# Patient Record
Sex: Male | Born: 1960 | Race: White | Hispanic: No | Marital: Married | Smoking: Former smoker
Health system: Southern US, Community
[De-identification: ages and names within clinical notes are randomized; demographics above are authoritative.]

## PROBLEM LIST (undated history)

## (undated) DIAGNOSIS — K37 Unspecified appendicitis: Secondary | ICD-10-CM

## (undated) DIAGNOSIS — E079 Disorder of thyroid, unspecified: Secondary | ICD-10-CM

## (undated) HISTORY — DX: Unspecified appendicitis: K37

## (undated) HISTORY — PX: HERNIA REPAIR: SHX51

---
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
## 2007-04-03 ENCOUNTER — Emergency Department: Payer: Self-pay | Admitting: Emergency Medicine

## 2008-12-06 ENCOUNTER — Emergency Department: Payer: Self-pay | Admitting: Unknown Physician Specialty

## 2008-12-17 DIAGNOSIS — Z8249 Family history of ischemic heart disease and other diseases of the circulatory system: Secondary | ICD-10-CM | POA: Insufficient documentation

## 2008-12-20 DIAGNOSIS — Z87891 Personal history of nicotine dependence: Secondary | ICD-10-CM | POA: Insufficient documentation

## 2009-01-27 DIAGNOSIS — E781 Pure hyperglyceridemia: Secondary | ICD-10-CM | POA: Insufficient documentation

## 2013-10-22 HISTORY — PX: THYROIDECTOMY: SHX17

## 2013-10-27 ENCOUNTER — Ambulatory Visit: Payer: Self-pay | Admitting: Otolaryngology

## 2013-11-18 ENCOUNTER — Ambulatory Visit: Payer: Self-pay | Admitting: Otolaryngology

## 2013-11-26 LAB — PATHOLOGY REPORT

## 2014-03-23 LAB — LIPID PANEL
Cholesterol: 189 mg/dL (ref 0–200)
HDL: 67 mg/dL (ref 35–70)
LDL Cholesterol: 97 mg/dL
Triglycerides: 123 mg/dL (ref 40–160)

## 2014-03-23 LAB — BASIC METABOLIC PANEL
BUN: 19 mg/dL (ref 4–21)
Creatinine: 1.2 mg/dL (ref 0.6–1.3)
Glucose: 99 mg/dL
Potassium: 4.5 mmol/L (ref 3.4–5.3)
Sodium: 141 mmol/L (ref 137–147)

## 2014-03-23 LAB — PSA: PSA: 2.7

## 2014-08-26 ENCOUNTER — Ambulatory Visit: Payer: Self-pay | Admitting: Surgery

## 2014-08-26 LAB — CBC WITH DIFFERENTIAL/PLATELET
Basophil #: 0.1 10*3/uL (ref 0.0–0.1)
Basophil %: 0.6 %
Eosinophil #: 0.2 10*3/uL (ref 0.0–0.7)
Eosinophil %: 2.1 %
HCT: 41.5 % (ref 40.0–52.0)
HGB: 13.1 g/dL (ref 13.0–18.0)
Lymphocyte #: 2 10*3/uL (ref 1.0–3.6)
Lymphocyte %: 24 %
MCH: 22 pg — ABNORMAL LOW (ref 26.0–34.0)
MCHC: 31.7 g/dL — ABNORMAL LOW (ref 32.0–36.0)
MCV: 69 fL — ABNORMAL LOW (ref 80–100)
Monocyte #: 0.5 x10 3/mm (ref 0.2–1.0)
Monocyte %: 5.8 %
Neutrophil #: 5.5 10*3/uL (ref 1.4–6.5)
Neutrophil %: 67.5 %
Platelet: 152 10*3/uL (ref 150–440)
RBC: 5.98 10*6/uL — ABNORMAL HIGH (ref 4.40–5.90)
RDW: 15.5 % — ABNORMAL HIGH (ref 11.5–14.5)
WBC: 8.2 10*3/uL (ref 3.8–10.6)

## 2014-09-02 ENCOUNTER — Ambulatory Visit: Payer: Self-pay | Admitting: Surgery

## 2014-10-08 ENCOUNTER — Ambulatory Visit: Payer: Self-pay | Admitting: Gastroenterology

## 2014-10-08 LAB — HM COLONOSCOPY

## 2015-02-12 NOTE — Op Note (Signed)
PATIENT NAME:  [name] Grimes Grimes, [name] Grimes MR#:  [id number] DATE OF BIRTH:  09-Feb-1961  DATE OF PROCEDURE:  09/02/2014  PREOPERATIVE DIAGNOSIS: Umbilical hernia.   POSTOPERATIVE DIAGNOSIS: Umbilical hernia.   PROCEDURE: Umbilical hernia repair.   SURGEON: Renda RollsWilton Lelynd Poer, MD    ANESTHESIA: General.   INDICATIONS: This 54 year old male reports bulging in the upper aspect of the abdomen. An umbilical hernia was demonstrated on physical exam, some 3 cm in dimension and repair was recommended for definitive treatment.   DESCRIPTION OF PROCEDURE: The patient was placed on the operating table in the supine position under general anesthesia. The abdomen was prepared with ChloraPrep and draped in a sterile manner. The bulge was noted in the upper aspect of the umbilicus approximately 3 cm in dimension. A transversely oriented supraumbilical curvilinear incision was made some 3 cm in length, carried down through a thin layer of subcutaneous tissue to encounter a herniated properitoneal fat which was dissected free from surrounding structures.  It was dissected away from the fascial ring defect and was inverted into the abdominal cavity.  Next the properitoneal fat was dissected away from the fascia circumferentially. A Bard soft mesh was cut to create an oval shape of some 1.2 x 1.8 cm in place in the properitoneal plane, oriented transversely, and was sutured to the overlying fascia with 0 Surgilon sutures. Next, the repair was carried out with a transversely oriented suture line of interrupted 0 Surgilon figure-of-eight sutures incorporating mesh into each suture. The repair looked good.  Hemostasis was intact. The fascia was infiltrated with 0.5% Sensorcaine with epinephrine. Subcutaneous tissues were infiltrated as well. The skin of the umbilicus was sutured to the fatty tissues just above the repair with 4-0 Monocryl. The skin was closed with running 4-0 Monocryl subcuticular suture and Dermabond. The patient tolerated  surgery satisfactorily and was prepared for transfer to the recovery room.     ____________________________ [name] Grimes CommonsJ. Renda RollsWilton Ahmaya Ostermiller, MD jws:DT D: 09/02/2014 14:18:32 ET T: 09/02/2014 17:28:21 ET JOB#: [id number]  cc: [name] Grimes HareJ. [name] Grimes Lilburn Straw, MD, <Dictator> [name] Grimes HareWILTON J Taevion Sikora MD ELECTRONICALLY SIGNED 09/05/2014 8:02

## 2015-02-12 NOTE — Op Note (Signed)
PATIENT NAME:  [name] Grimes, [name] Grimes MR#:  [id number] DATE OF BIRTH:  1961/06/01  DATE OF PROCEDURE:  11/18/2013  PREOPERATIVE DIAGNOSIS: Right thyroid nodules.   POSTOPERATIVE DIAGNOSIS: Right thyroid nodules.   PROCEDURE: Right hemithyroidectomy with isthmusectomy.   SURGEON: Ollen Grossaul S. Willeen CassBennett, MD  FIRST ASSISTANT: Cammy CopaPaul H. Juengel, MD  ANESTHESIA: General endotracheal.   INDICATIONS: The patient with multinodular thyroid gland with dominant nodules on the right side. FNA was nondiagnostic. One of the nodules appears to have enlarged recently.   FINDINGS: There were 2 prominent nodules in the right thyroid gland, each one nearly 2 cm in size.   COMPLICATIONS: None.   DESCRIPTION OF PROCEDURE: After obtaining informed consent, the patient was taken to the operating room and placed in the supine position. General endotracheal anesthesia was induced. The endotracheal tube was placed under direct visualization, and I was able to observe proper placement of the laryngeal nerve monitor electrodes between the vocal cords during the intubation to assure good positioning. The skin was then injected with 1% lidocaine with epinephrine 1:200,000. The patient was then prepped and draped in the usual sterile fashion. A 15-blade was used to incise the skin, carrying the incision down through the platysma with the Bovie. The strap muscles were identified and divided in the midline with the Harmonic scalpel. The strap muscles on the right side were then carefully dissected away from the surface of the thyroid gland and retracted laterally. Blunt dissection was then used to expose the thyroid gland on the right side. Dissection proceeded up into the superior pole, where superior pole vessels were identified and divided with the Harmonic scalpel as they were encountered. The vessels were divided right on the capsule of the gland, and a structure consistent with parathyroid tissue was identified and preserved along with  its vascular pedicle during this dissection. Dissection proceeded down the lateral surface of the gland, dissecting away soft fascial attachments either bluntly or with the Harmonic scalpel. Inferiorly, the inferior pole vessels were also dissected out and divided at the level of the capsule of the gland with the Harmonic scalpel. Once again, a small structure consistent with parathyroid tissue was identified and preserved within the vascular pedicle and dissected away from the thyroid gland itself. There were large nodules, both more superficially along the inferior pole of the thyroid gland near the isthmus as well as a deeper large nodule more in the midportion of the gland. These were carefully dissected away from surrounding tissues and the thyroid rotated medially. The recurrent laryngeal nerve was identified where it entered the larynx superiorly and confirmed with the nerve stimulator. The nerve was dissected out a short ways inferiorly to identify its course and then the gland dissected away from the trachea, dividing Berry's ligament with the nerve in full view. The gland was dissected completely off the face of the trachea and divided just past midline, removing the isthmus of the gland from the left lobe of the thyroid gland. The wound was irrigated and inspected. There was some minor oozing around Berry's ligament near the recurrent laryngeal nerve, and this was managed with Surgicel. A #10 TLS drain was placed and secured with a 3-0 Prolene suture. The strap muscles were reapproximated with 4-0 Vicryl. The subcutaneous closure was performed with 4-0 Vicryl suture, reapproximating the platysma muscle in the process. The skin was then closed with a running subcuticular 3-0 Prolene suture. Bacitracin ointment was then applied. The patient was then returned to the anesthesiologist for awakening. He was  awakened and taken to the recovery room in good condition postoperatively. Blood loss was less than 50  mL. The nerve was noted to stimulate appropriately at the completion of the case.   ____________________________ Ollen Gross. Willeen Cass, MD psb:lb D: 11/18/2013 09:33:48 ET T: 11/18/2013 09:44:07 ET JOB#: [id number]  cc: Ollen Gross. Willeen Cass, MD, <Dictator> Sandi Mealy MD ELECTRONICALLY SIGNED 11/25/2013 16:55

## 2015-02-14 LAB — SURGICAL PATHOLOGY

## 2015-12-14 DIAGNOSIS — Z86010 Personal history of colonic polyps: Secondary | ICD-10-CM | POA: Insufficient documentation

## 2015-12-14 DIAGNOSIS — Q792 Exomphalos: Secondary | ICD-10-CM | POA: Insufficient documentation

## 2015-12-23 ENCOUNTER — Encounter: Payer: Self-pay | Admitting: Family Medicine

## 2015-12-23 ENCOUNTER — Ambulatory Visit (INDEPENDENT_AMBULATORY_CARE_PROVIDER_SITE_OTHER): Payer: Managed Care, Other (non HMO) | Admitting: Family Medicine

## 2015-12-23 VITALS — BP 138/78 | HR 54 | Temp 97.9°F | Resp 16 | Ht 72.0 in | Wt 222.0 lb

## 2015-12-23 DIAGNOSIS — Z8639 Personal history of other endocrine, nutritional and metabolic disease: Secondary | ICD-10-CM

## 2015-12-23 DIAGNOSIS — Z0000 Encounter for general adult medical examination without abnormal findings: Secondary | ICD-10-CM | POA: Diagnosis not present

## 2015-12-23 DIAGNOSIS — Z8739 Personal history of other diseases of the musculoskeletal system and connective tissue: Secondary | ICD-10-CM | POA: Insufficient documentation

## 2015-12-23 NOTE — Progress Notes (Signed)
Patient: [name] Grimes, Male    DOB: 11/23/1960, 55 y.o.   MRN: [id number] Visit Date: 12/23/2015  Today's Provider: Mila Merryonald Fisher, MD   Chief Complaint  Patient presents with  . Annual Exam  . Hyperlipidemia    follow up   Subjective:    Annual physical exam [name] Grimes is a 55 y.o. male who presents today for health maintenance and complete physical. He feels well. He reports exercising daily. He reports he is sleeping fairly well.  -------------------------------------------   He states he has had a few episodes of pain and swelling in his toes after eating shellfish and thinks that they were episodes of gout, which runs in his family.    Review of Systems  Constitutional: Negative for fever, chills, appetite change and fatigue.  HENT: Positive for tinnitus. Negative for congestion, ear pain, hearing loss, nosebleeds and trouble swallowing.   Eyes: Negative for pain and visual disturbance.  Respiratory: Negative for cough, chest tightness and shortness of breath.   Cardiovascular: Negative for chest pain, palpitations and leg swelling.  Gastrointestinal: Negative for nausea, vomiting, abdominal pain, diarrhea, constipation and blood in stool.  Endocrine: Negative for polydipsia, polyphagia and polyuria.  Genitourinary: Positive for frequency. Negative for dysuria and flank pain.  Musculoskeletal: Negative for myalgias, back pain, joint swelling, arthralgias and neck stiffness.       Pain in left heel  Skin: Negative for color change, rash and wound.  Neurological: Negative for dizziness, tremors, seizures, speech difficulty, weakness, light-headedness and headaches.  Psychiatric/Behavioral: Negative for behavioral problems, confusion, sleep disturbance, dysphoric mood and decreased concentration. The patient is not nervous/anxious.   All other systems reviewed and are negative.   Social History      He  reports that he has quit smoking. He does not have any  smokeless tobacco history on file. He reports that he drinks alcohol. He reports that he does not use illicit drugs.       Social History   Social History  . Marital Status: Married    Spouse Name: N/A  . Number of Children: 2  . Years of Education: N/A   Occupational History  . Pharmacutical representative    Social History Main Topics  . Smoking status: Former Games developermoker  . Smokeless tobacco: None  . Alcohol Use: 0.0 oz/week    0 Standard drinks or equivalent per week     Comment: drinks 2 beers on the week ends  . Drug Use: No  . Sexual Activity: Not Asked   Other Topics Concern  . None   Social History Narrative    History reviewed. No pertinent past medical history.   Patient Active Problem List   Diagnosis Date Noted  . H/O adenomatous polyp of colon 12/14/2015  . 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 Pure hyperglyceridemia 01/27/2009  . History of smoking 12/20/2008  . Family history of cardiovascular disease 12/17/2008    Past Surgical History  Procedure Laterality Date  . Thyroidectomy Right 10/2013    Dr. Rob BuntingBennett-partial-for thyroid mass  . Tonsillectomy  1965  . Hernia repair      umbilical hernia repair. Done by Dr. Katrinka BlazingSmith    Family History        Family Status  Relation Status Death Age  . Father Deceased   . Brother Alive   . Maternal Grandfather Deceased   . Paternal Grandmother Deceased   . Mother Alive   . Sister Alive   . Maternal Grandmother Deceased   .  Paternal Grandfather Deceased   . Son Alive   . Son Alive         His family history includes Celiac disease in his father; Congestive Heart Failure in his maternal grandfather; Diabetes in his maternal grandfather and paternal grandmother; Heart attack in his father; Osteoporosis in his brother; Stroke in his paternal grandmother.    Allergies  Allergen Reactions  . Tetracycline Rash    Previous Medications   ASPIRIN 81 MG TABLET        Patient Care Team: Malva Limes, MD as PCP -  General (Family Medicine)     Objective:   Vitals: BP 138/78 mmHg  Pulse 54  Temp(Src) 97.9 F (36.6 C) (Oral)  Resp 16  Ht 6' (1.829 m)  Wt 222 lb (100.699 kg)  BMI 30.10 kg/m2  SpO2 99%   Physical Exam   General Appearance:    Alert, cooperative, no distress, appears stated age  Head:    Normocephalic, without obvious abnormality, atraumatic  Eyes:    PERRL, conjunctiva/corneas clear, EOM's intact, fundi    benign, both eyes       Ears:    Normal TM's and external ear canals, both ears  Nose:   Nares normal, septum midline, mucosa normal, no drainage   or sinus tenderness  Throat:   Lips, mucosa, and tongue normal; teeth and gums normal  Neck:   Supple, symmetrical, trachea midline, no adenopathy;       thyroid:  No enlargement/tenderness/nodules; no carotid   bruit or JVD  Back:     Symmetric, no curvature, ROM normal, no CVA tenderness  Lungs:     Clear to auscultation bilaterally, respirations unlabored  Chest wall:    No tenderness or deformity  Heart:    Regular rate and rhythm, S1 and S2 normal, no murmur, rub   or gallop  Abdomen:     Soft, non-tender, bowel sounds active all four quadrants,    no masses, no organomegaly  Genitalia:    deferred  Rectal:    deferred  Extremities:   Extremities normal, atraumatic, no cyanosis or edema  Pulses:   2+ and symmetric all extremities  Skin:   Skin color, texture, turgor normal, no rashes or lesions  Lymph nodes:   Cervical, supraclavicular, and axillary nodes normal  Neurologic:   CNII-XII intact. Normal strength, sensation and reflexes      throughout   Depression Screen PHQ 2/9 Scores 12/23/2015  PHQ - 2 Score 0  PHQ- 9 Score 1      Assessment & Plan:     Routine Health Maintenance and Physical Exam  Exercise Activities and Dietary recommendations Goals    None      Immunization History  Administered Date(s) Administered  . Td 04/03/2007  . Tdap 03/16/2014    Health Maintenance  Topic Date Due    . HIV Screening  12/28/1973  . INFLUENZA VACCINE  05/22/2016 (Originally 05/23/2015)  . COLONOSCOPY  10/09/2019  . TETANUS/TDAP  03/16/2024  . Hepatitis C Screening  Completed      Discussed health benefits of physical activity, and encouraged him to engage in regular exercise appropriate for his age and condition.    -------------------------------------------------------------------- 1. Annual physical exam  - Lipid panel - Comprehensive metabolic panel - PSA  2. History of gout Suspected by patient history. For now seems to be controlled with dietary measures. Establish baseline uric acid. Consider allopurinol if uric acid is very high. - Uric acid

## 2015-12-24 LAB — LIPID PANEL
Chol/HDL Ratio: 3 ratio units (ref 0.0–5.0)
Cholesterol, Total: 196 mg/dL (ref 100–199)
HDL: 66 mg/dL (ref >39)
LDL Calculated: 99 mg/dL (ref 0–99)
Triglycerides: 154 mg/dL — ABNORMAL HIGH (ref 0–149)
VLDL Cholesterol Cal: 31 mg/dL (ref 5–40)

## 2015-12-24 LAB — COMPREHENSIVE METABOLIC PANEL
ALT: 18 IU/L (ref 0–44)
AST: 17 IU/L (ref 0–40)
Albumin/Globulin Ratio: 1.9 (ref 1.1–2.5)
Albumin: 4.4 g/dL (ref 3.5–5.5)
Alkaline Phosphatase: 67 IU/L (ref 39–117)
BUN/Creatinine Ratio: 14 (ref 9–20)
BUN: 18 mg/dL (ref 6–24)
Bilirubin Total: 0.7 mg/dL (ref 0.0–1.2)
CO2: 25 mmol/L (ref 18–29)
Calcium: 9.3 mg/dL (ref 8.7–10.2)
Chloride: 100 mmol/L (ref 96–106)
Creatinine, Ser: 1.31 mg/dL — ABNORMAL HIGH (ref 0.76–1.27)
GFR calc Af Amer: 70 mL/min/{1.73_m2} (ref >59)
GFR calc non Af Amer: 60 mL/min/{1.73_m2} (ref >59)
Globulin, Total: 2.3 g/dL (ref 1.5–4.5)
Glucose: 110 mg/dL — ABNORMAL HIGH (ref 65–99)
Potassium: 5 mmol/L (ref 3.5–5.2)
Sodium: 139 mmol/L (ref 134–144)
Total Protein: 6.7 g/dL (ref 6.0–8.5)

## 2015-12-24 LAB — URIC ACID: Uric Acid: 8.6 mg/dL (ref 3.7–8.6)

## 2015-12-24 LAB — PSA: Prostate Specific Ag, Serum: 2.3 ng/mL (ref 0.0–4.0)

## 2016-01-19 ENCOUNTER — Ambulatory Visit
Admission: RE | Admit: 2016-01-19 | Discharge: 2016-01-19 | Disposition: A | Discharge status: Home / Self Care | Payer: Managed Care, Other (non HMO) | Source: Ambulatory Visit | Attending: Family Medicine | Admitting: Family Medicine

## 2016-01-19 ENCOUNTER — Encounter: Payer: Self-pay | Admitting: Family Medicine

## 2016-01-19 ENCOUNTER — Ambulatory Visit (INDEPENDENT_AMBULATORY_CARE_PROVIDER_SITE_OTHER): Payer: Managed Care, Other (non HMO) | Admitting: Family Medicine

## 2016-01-19 ENCOUNTER — Encounter
Admission: EM | Disposition: A | Discharge status: Home / Self Care | Payer: Self-pay | Source: Home / Self Care | Attending: Surgery

## 2016-01-19 ENCOUNTER — Emergency Department: Payer: Managed Care, Other (non HMO) | Admitting: Certified Registered"

## 2016-01-19 ENCOUNTER — Other Ambulatory Visit
Admission: RE | Admit: 2016-01-19 | Discharge: 2016-01-19 | Disposition: A | Discharge status: Home / Self Care | Payer: Managed Care, Other (non HMO) | Source: Ambulatory Visit | Attending: Family Medicine | Admitting: Family Medicine

## 2016-01-19 ENCOUNTER — Inpatient Hospital Stay
Admission: EM | Admit: 2016-01-19 | Discharge: 2016-01-23 | DRG: 339 | Disposition: A | Discharge status: Home / Self Care | Payer: Managed Care, Other (non HMO) | Attending: Surgery | Admitting: Surgery

## 2016-01-19 VITALS — BP 142/64 | HR 80 | Temp 98.2°F | Resp 18 | Wt 219.0 lb

## 2016-01-19 DIAGNOSIS — Z87891 Personal history of nicotine dependence: Secondary | ICD-10-CM

## 2016-01-19 DIAGNOSIS — Z5331 Laparoscopic surgical procedure converted to open procedure: Secondary | ICD-10-CM

## 2016-01-19 DIAGNOSIS — Z8379 Family history of other diseases of the digestive system: Secondary | ICD-10-CM | POA: Diagnosis not present

## 2016-01-19 DIAGNOSIS — K353 Acute appendicitis with localized peritonitis: Principal | ICD-10-CM | POA: Diagnosis present

## 2016-01-19 DIAGNOSIS — R509 Fever, unspecified: Secondary | ICD-10-CM | POA: Insufficient documentation

## 2016-01-19 DIAGNOSIS — I959 Hypotension, unspecified: Secondary | ICD-10-CM | POA: Diagnosis not present

## 2016-01-19 DIAGNOSIS — Z823 Family history of stroke: Secondary | ICD-10-CM | POA: Diagnosis not present

## 2016-01-19 DIAGNOSIS — Z7982 Long term (current) use of aspirin: Secondary | ICD-10-CM | POA: Diagnosis not present

## 2016-01-19 DIAGNOSIS — Y838 Other surgical procedures as the cause of abnormal reaction of the patient, or of later complication, without mention of misadventure at the time of the procedure: Secondary | ICD-10-CM | POA: Diagnosis not present

## 2016-01-19 DIAGNOSIS — Z833 Family history of diabetes mellitus: Secondary | ICD-10-CM

## 2016-01-19 DIAGNOSIS — K3580 Unspecified acute appendicitis: Secondary | ICD-10-CM | POA: Insufficient documentation

## 2016-01-19 DIAGNOSIS — I2510 Atherosclerotic heart disease of native coronary artery without angina pectoris: Secondary | ICD-10-CM | POA: Insufficient documentation

## 2016-01-19 DIAGNOSIS — I96 Gangrene, not elsewhere classified: Secondary | ICD-10-CM | POA: Diagnosis present

## 2016-01-19 DIAGNOSIS — Z8249 Family history of ischemic heart disease and other diseases of the circulatory system: Secondary | ICD-10-CM | POA: Diagnosis not present

## 2016-01-19 DIAGNOSIS — R1031 Right lower quadrant pain: Secondary | ICD-10-CM

## 2016-01-19 DIAGNOSIS — K91841 Postprocedural hemorrhage and hematoma of a digestive system organ or structure following other procedure: Secondary | ICD-10-CM | POA: Diagnosis not present

## 2016-01-19 DIAGNOSIS — K35891 Other acute appendicitis without perforation, with gangrene: Secondary | ICD-10-CM | POA: Diagnosis present

## 2016-01-19 HISTORY — PX: LAPAROSCOPIC APPENDECTOMY: SHX408

## 2016-01-19 HISTORY — DX: Disorder of thyroid, unspecified: E07.9

## 2016-01-19 LAB — COMPREHENSIVE METABOLIC PANEL
ALT: 23 U/L (ref 17–63)
AST: 19 U/L (ref 15–41)
Albumin: 3.9 g/dL (ref 3.5–5.0)
Alkaline Phosphatase: 74 U/L (ref 38–126)
Anion gap: 6 (ref 5–15)
BUN: 17 mg/dL (ref 6–20)
CO2: 26 mmol/L (ref 22–32)
Calcium: 8.6 mg/dL — ABNORMAL LOW (ref 8.9–10.3)
Chloride: 103 mmol/L (ref 101–111)
Creatinine, Ser: 1.26 mg/dL — ABNORMAL HIGH (ref 0.61–1.24)
GFR calc Af Amer: >60 mL/min (ref >60)
GFR calc non Af Amer: >60 mL/min (ref >60)
Glucose, Bld: 115 mg/dL — ABNORMAL HIGH (ref 65–99)
Potassium: 4.5 mmol/L (ref 3.5–5.1)
Sodium: 135 mmol/L (ref 135–145)
Total Bilirubin: 1.1 mg/dL (ref 0.3–1.2)
Total Protein: 7.2 g/dL (ref 6.5–8.1)

## 2016-01-19 LAB — CBC
HCT: 41.6 % (ref 40.0–52.0)
Hemoglobin: 13.3 g/dL (ref 13.0–18.0)
MCH: 21 pg — ABNORMAL LOW (ref 26.0–34.0)
MCHC: 32 g/dL (ref 32.0–36.0)
MCV: 65.6 fL — ABNORMAL LOW (ref 80.0–100.0)
Platelets: 146 10*3/uL — ABNORMAL LOW (ref 150–440)
RBC: 6.35 MIL/uL — ABNORMAL HIGH (ref 4.40–5.90)
RDW: 15.7 % — ABNORMAL HIGH (ref 11.5–14.5)
WBC: 15.5 10*3/uL — ABNORMAL HIGH (ref 3.8–10.6)

## 2016-01-19 SURGERY — APPENDECTOMY, LAPAROSCOPIC
Anesthesia: General | Wound class: Clean Contaminated

## 2016-01-19 MED ORDER — SUGAMMADEX SODIUM 200 MG/2ML IV SOLN
INTRAVENOUS | Status: DC | PRN
Start: 2016-01-19 — End: 2016-01-19
  Administered 2016-01-19: 200 mg via INTRAVENOUS

## 2016-01-19 MED ORDER — LACTATED RINGERS IV BOLUS (SEPSIS)
1000.0000 mL | Freq: Once | INTRAVENOUS | Status: AC
  Administered 2016-01-19: 1000 mL via INTRAVENOUS

## 2016-01-19 MED ORDER — CETYLPYRIDINIUM CHLORIDE 0.05 % MT LIQD
7.0000 mL | Freq: Two times a day (BID) | OROMUCOSAL | Status: DC
  Administered 2016-01-20 – 2016-01-21 (×2): 7 mL via OROMUCOSAL

## 2016-01-19 MED ORDER — HYDROMORPHONE HCL 1 MG/ML IJ SOLN
INTRAMUSCULAR | Status: AC
  Filled 2016-01-19: qty 1

## 2016-01-19 MED ORDER — FENTANYL CITRATE (PF) 100 MCG/2ML IJ SOLN: 25.0000 ug | INTRAMUSCULAR | Status: DC | PRN

## 2016-01-19 MED ORDER — HEPARIN SODIUM (PORCINE) 5000 UNIT/ML IJ SOLN: 5000.0000 [IU] | Freq: Three times a day (TID) | INTRAMUSCULAR | Status: DC

## 2016-01-19 MED ORDER — ONDANSETRON HCL 4 MG/2ML IJ SOLN
4.0000 mg | Freq: Once | INTRAMUSCULAR | Status: AC
  Administered 2016-01-19: 4 mg via INTRAVENOUS

## 2016-01-19 MED ORDER — ONDANSETRON HCL 4 MG/2ML IJ SOLN
INTRAMUSCULAR | Status: DC | PRN
  Administered 2016-01-19: 4 mg via INTRAVENOUS

## 2016-01-19 MED ORDER — LACTATED RINGERS IV SOLN
INTRAVENOUS | Status: DC | PRN
  Administered 2016-01-19 (×2): via INTRAVENOUS

## 2016-01-19 MED ORDER — DEXTROSE 5 % IV SOLN
1.0000 g | Freq: Four times a day (QID) | INTRAVENOUS | Status: DC
  Administered 2016-01-19 – 2016-01-23 (×14): 1 g via INTRAVENOUS
  Filled 2016-01-19 (×17): qty 1

## 2016-01-19 MED ORDER — MORPHINE SULFATE (PF) 4 MG/ML IV SOLN
4.0000 mg | Freq: Once | INTRAVENOUS | Status: AC
  Administered 2016-01-19: 4 mg via INTRAVENOUS

## 2016-01-19 MED ORDER — HEPARIN SODIUM (PORCINE) 5000 UNIT/ML IJ SOLN
5000.0000 [IU] | Freq: Three times a day (TID) | INTRAMUSCULAR | Status: DC
  Administered 2016-01-20 – 2016-01-23 (×9): 5000 [IU] via SUBCUTANEOUS
  Filled 2016-01-19 (×9): qty 1

## 2016-01-19 MED ORDER — ASPIRIN EC 81 MG PO TBEC: 81.0000 mg | DELAYED_RELEASE_TABLET | Freq: Every day | ORAL | Status: DC

## 2016-01-19 MED ORDER — DEXTROSE 5 % IV SOLN
2.0000 g | Freq: Once | INTRAVENOUS | Status: AC
  Administered 2016-01-19: 2 g via INTRAVENOUS

## 2016-01-19 MED ORDER — MORPHINE SULFATE (PF) 2 MG/ML IV SOLN
2.0000 mg | INTRAVENOUS | Status: DC | PRN
  Administered 2016-01-19 – 2016-01-20 (×5): 2 mg via INTRAVENOUS
  Filled 2016-01-19 (×5): qty 1

## 2016-01-19 MED ORDER — ONDANSETRON HCL 4 MG/2ML IJ SOLN
INTRAMUSCULAR | Status: AC
  Administered 2016-01-19: 4 mg via INTRAVENOUS
  Filled 2016-01-19: qty 2

## 2016-01-19 MED ORDER — PROPOFOL 10 MG/ML IV BOLUS
INTRAVENOUS | Status: DC | PRN
  Administered 2016-01-19: 200 mg via INTRAVENOUS

## 2016-01-19 MED ORDER — SUCCINYLCHOLINE CHLORIDE 20 MG/ML IJ SOLN
INTRAMUSCULAR | Status: DC | PRN
  Administered 2016-01-19: 100 mg via INTRAVENOUS

## 2016-01-19 MED ORDER — ACETAMINOPHEN 10 MG/ML IV SOLN
INTRAVENOUS | Status: DC | PRN
  Administered 2016-01-19: 1000 mg via INTRAVENOUS

## 2016-01-19 MED ORDER — MIDAZOLAM HCL 2 MG/2ML IJ SOLN
INTRAMUSCULAR | Status: DC | PRN
  Administered 2016-01-19: 2 mg via INTRAVENOUS

## 2016-01-19 MED ORDER — MORPHINE SULFATE (PF) 4 MG/ML IV SOLN
INTRAVENOUS | Status: AC
  Administered 2016-01-19: 4 mg via INTRAVENOUS
  Filled 2016-01-19: qty 1

## 2016-01-19 MED ORDER — LACTATED RINGERS IV SOLN
INTRAVENOUS | Status: DC
  Administered 2016-01-19 – 2016-01-20 (×4): via INTRAVENOUS

## 2016-01-19 MED ORDER — LACTATED RINGERS IV SOLN: INTRAVENOUS | Status: DC | PRN

## 2016-01-19 MED ORDER — SODIUM CHLORIDE 0.9 % IV BOLUS (SEPSIS)
1000.0000 mL | Freq: Once | INTRAVENOUS | Status: AC
  Administered 2016-01-19: 1000 mL via INTRAVENOUS

## 2016-01-19 MED ORDER — IOPAMIDOL (ISOVUE-370) INJECTION 76%
100.0000 mL | Freq: Once | INTRAVENOUS | Status: AC | PRN
  Administered 2016-01-19: 100 mL via INTRAVENOUS

## 2016-01-19 MED ORDER — ONDANSETRON HCL 4 MG/2ML IJ SOLN: 4.0000 mg | Freq: Once | INTRAMUSCULAR | Status: DC | PRN

## 2016-01-19 MED ORDER — HYDROMORPHONE HCL 1 MG/ML IJ SOLN
INTRAMUSCULAR | Status: DC | PRN
  Administered 2016-01-19: 1 mg via INTRAVENOUS

## 2016-01-19 MED ORDER — OXYCODONE HCL 5 MG PO TABS
5.0000 mg | ORAL_TABLET | ORAL | Status: DC | PRN
  Administered 2016-01-21 – 2016-01-23 (×9): 5 mg via ORAL
  Filled 2016-01-19 (×9): qty 1

## 2016-01-19 MED ORDER — FENTANYL CITRATE (PF) 100 MCG/2ML IJ SOLN
INTRAMUSCULAR | Status: DC | PRN
  Administered 2016-01-19 (×2): 50 ug via INTRAVENOUS
  Administered 2016-01-19: 100 ug via INTRAVENOUS
  Administered 2016-01-19: 50 ug via INTRAVENOUS

## 2016-01-19 MED ORDER — ONDANSETRON HCL 4 MG/2ML IJ SOLN
4.0000 mg | INTRAMUSCULAR | Status: DC | PRN
  Administered 2016-01-19 – 2016-01-20 (×3): 4 mg via INTRAVENOUS
  Filled 2016-01-19: qty 2

## 2016-01-19 MED ORDER — ROCURONIUM BROMIDE 100 MG/10ML IV SOLN
INTRAVENOUS | Status: DC | PRN
  Administered 2016-01-19: 10 mg via INTRAVENOUS
  Administered 2016-01-19: 30 mg via INTRAVENOUS
  Administered 2016-01-19 (×2): 10 mg via INTRAVENOUS

## 2016-01-19 SURGICAL SUPPLY — 45 items
ADHESIVE MASTISOL STRL (MISCELLANEOUS) ×2 IMPLANT
APPLIER CLIP 5 13 M/L LIGAMAX5 (MISCELLANEOUS) ×2
APPLIER CLIP ROT 10 11.4 M/L (STAPLE) ×4
BLADE SURG SZ11 CARB STEEL (BLADE) ×2 IMPLANT
BULB RESERV EVAC DRAIN JP 100C (MISCELLANEOUS) IMPLANT
CANISTER SUCT 3000ML (MISCELLANEOUS) ×2 IMPLANT
CATH TRAY 16F METER LATEX (MISCELLANEOUS) ×2 IMPLANT
CHLORAPREP W/TINT 26ML (MISCELLANEOUS) ×2 IMPLANT
CLIP APPLIE 5 13 M/L LIGAMAX5 (MISCELLANEOUS) ×1 IMPLANT
CLIP APPLIE ROT 10 11.4 M/L (STAPLE) ×2 IMPLANT
CUTTER FLEX LINEAR 45M (STAPLE) ×2 IMPLANT
DEVICE TROCAR PUNCTURE CLOSURE (ENDOMECHANICALS) ×2 IMPLANT
ELECT REM PT RETURN 9FT ADLT (ELECTROSURGICAL) ×2
ELECTRODE REM PT RTRN 9FT ADLT (ELECTROSURGICAL) ×1 IMPLANT
ENDOPOUCH RETRIEVER 10 (MISCELLANEOUS) ×2 IMPLANT
GAUZE SPONGE NON-WVN 2X2 STRL (MISCELLANEOUS) ×3 IMPLANT
GLOVE BIO SURGEON STRL SZ8 (GLOVE) ×2 IMPLANT
GOWN STRL REUS W/ TWL LRG LVL3 (GOWN DISPOSABLE) ×2 IMPLANT
GOWN STRL REUS W/TWL LRG LVL3 (GOWN DISPOSABLE) ×2
IRRIGATION STRYKERFLOW (MISCELLANEOUS) ×2 IMPLANT
IRRIGATOR STRYKERFLOW (MISCELLANEOUS) ×4
JACKSON PRATT 10 (INSTRUMENTS) ×2 IMPLANT
KIT RM TURNOVER STRD PROC AR (KITS) ×2 IMPLANT
LABEL OR SOLS (LABEL) ×2 IMPLANT
NDL SAFETY 22GX1.5 (NEEDLE) ×2 IMPLANT
NEEDLE VERESS 14GA 120MM (NEEDLE) ×2 IMPLANT
NS IRRIG 500ML POUR BTL (IV SOLUTION) ×2 IMPLANT
PACK LAP CHOLECYSTECTOMY (MISCELLANEOUS) ×2 IMPLANT
RELOAD 45 VASCULAR/THIN (ENDOMECHANICALS) ×2 IMPLANT
RELOAD STAPLE TA45 3.5 REG BLU (ENDOMECHANICALS) ×2 IMPLANT
SCISSORS METZENBAUM CVD 33 (INSTRUMENTS) IMPLANT
SLEEVE ENDOPATH XCEL 5M (ENDOMECHANICALS) ×6 IMPLANT
SOL .9 NS 3000ML IRR  AL (IV SOLUTION) ×1
SOL .9 NS 3000ML IRR UROMATIC (IV SOLUTION) ×1 IMPLANT
SPONGE LAP 18X18 5 PK (GAUZE/BANDAGES/DRESSINGS) ×2 IMPLANT
SPONGE VERSALON 2X2 STRL (MISCELLANEOUS) ×3
STRIP CLOSURE SKIN 1/2X4 (GAUZE/BANDAGES/DRESSINGS) ×2 IMPLANT
SUT MNCRL 4-0 (SUTURE) ×1
SUT MNCRL 4-0 27XMFL (SUTURE) ×1
SUT VICRYL 0 TIES 12 18 (SUTURE) ×2 IMPLANT
SUTURE MNCRL 4-0 27XMF (SUTURE) ×1 IMPLANT
TRAP SPECIMEN MUCOUS 40CC (MISCELLANEOUS) ×2 IMPLANT
TROCAR XCEL 12X100 BLDLESS (ENDOMECHANICALS) ×2 IMPLANT
TROCAR XCEL NON-BLD 5MMX100MML (ENDOMECHANICALS) ×2 IMPLANT
TUBING INSUFFLATOR HI FLOW (MISCELLANEOUS) ×2 IMPLANT

## 2016-01-19 NOTE — ED Notes (Signed)
MD at bedside. 

## 2016-01-19 NOTE — Anesthesia Preprocedure Evaluation (Signed)
Anesthesia Evaluation  Patient identified by MRN, date of birth, ID band Patient awake    Reviewed: Allergy & Precautions, NPO status , Patient's Chart, lab work & pertinent test results  Airway Mallampati: II  TM Distance: >3 FB Neck ROM: Full    Dental  (+) Teeth Intact   Pulmonary sleep apnea , former smoker,    breath sounds clear to auscultation       Cardiovascular Exercise Tolerance: Good  Rhythm:Regular Rate:Tachycardia     Neuro/Psych    GI/Hepatic negative GI ROS, Neg liver ROS,   Endo/Other  negative endocrine ROS  Renal/GU negative Renal ROS     Musculoskeletal negative musculoskeletal ROS (+)   Abdominal   Peds  Hematology negative hematology ROS (+)   Anesthesia Other Findings   Reproductive/Obstetrics                             Anesthesia Physical Anesthesia Plan  ASA: II and emergent  Anesthesia Plan: General   Post-op Pain Management:    Induction: Intravenous  Airway Management Planned: Oral ETT  Additional Equipment:   Intra-op Plan:   Post-operative Plan: Extubation in OR  Informed Consent: I have reviewed the patients History and Physical, chart, labs and discussed the procedure including the risks, benefits and alternatives for the proposed anesthesia with the patient or authorized representative who has indicated his/her understanding and acceptance.     Plan Discussed with: CRNA  Anesthesia Plan Comments:         Anesthesia Quick Evaluation

## 2016-01-19 NOTE — Anesthesia Procedure Notes (Signed)
Procedure Name: Intubation Performed by: Montrelle Eddings Pre-anesthesia Checklist: Patient identified, Patient being monitored, Timeout performed, Emergency Drugs available and Suction available Patient Re-evaluated:Patient Re-evaluated prior to inductionOxygen Delivery Method: Circle system utilized Preoxygenation: Pre-oxygenation with 100% oxygen Intubation Type: IV induction and Rapid sequence Laryngoscope Size: Miller and 2 Grade View: Grade I Tube type: Oral Tube size: 7.5 mm Number of attempts: 1 Airway Equipment and Method: Stylet Placement Confirmation: ETT inserted through vocal cords under direct vision,  positive ETCO2 and breath sounds checked- equal and bilateral Secured at: 23 cm Tube secured with: Tape Dental Injury: Teeth and Oropharynx as per pre-operative assessment        

## 2016-01-19 NOTE — H&P (Signed)
[name] Grimes is an 55 y.o. male.    Chief Complaint: Right lower quadrant pain  HPI: This patient with right lower quadrant pain started yesterday never had an episode like this before and it says been gradually worsening he's had some pain medication in the ED and it is better but still worsened then this morning. He has not been able to eat today is nauseated but has not vomited he denies fevers or chills points to the right lower quadrant. He denies dysuria.  Of note he has had a hemithyroidectomy as well as an umbilical hernia repair he believes mesh was placed.  Past Medical History  Diagnosis Date  . Thyroid disease     Past Surgical History  Procedure Laterality Date  . Thyroidectomy Right 10/2013    Dr. Cory Munch thyroid mass  . Tonsillectomy  1965  . Hernia repair      umbilical hernia repair. Done by Dr. Tamala Julian    Family History  Problem Relation Age of Onset  . Heart attack Father     MI in his 4s  . Celiac disease Father   . Osteoporosis Brother   . Diabetes Maternal Grandfather   . Congestive Heart Failure Maternal Grandfather   . Diabetes Paternal Grandmother   . Stroke Paternal Grandmother    Social History:  reports that he quit smoking about 10 years ago. His smoking use included Cigarettes. He has a 15 pack-year smoking history. He does not have any smokeless tobacco history on file. He reports that he drinks alcohol. He reports that he does not use illicit drugs.  Allergies:  Allergies  Allergen Reactions  . Tetracycline Rash     (Not in a hospital admission)   Review of Systems  Constitutional: Negative for fever, chills and weight loss.  HENT: Negative.   Eyes: Negative.   Respiratory: Negative.   Cardiovascular: Negative.   Gastrointestinal: Positive for nausea and abdominal pain. Negative for heartburn, vomiting, diarrhea, constipation, blood in stool and melena.  Genitourinary: Negative.   Musculoskeletal: Negative.   Skin:  Negative.   Neurological: Negative.   Endo/Heme/Allergies: Negative.   Psychiatric/Behavioral: Negative.      Physical Exam:  BP 163/81 mmHg  Pulse 90  Temp(Src) 99.2 F (37.3 C) (Oral)  Resp 18  Ht 6' (1.829 m)  Wt 219 lb (99.338 kg)  BMI 29.70 kg/m2  SpO2 99%  Physical Exam  Constitutional: He is oriented to person, place, and time and well-developed, well-nourished, and in no distress. No distress.  HENT:  Head: Normocephalic and atraumatic.  Transverse neck scar  Eyes: Pupils are equal, round, and reactive to light. Right eye exhibits no discharge. Left eye exhibits no discharge. No scleral icterus.  Neck: Normal range of motion. Neck supple.  Cardiovascular: Normal rate, regular rhythm and normal heart sounds.   No murmur heard. Pulmonary/Chest: Effort normal and breath sounds normal. No respiratory distress. He has no wheezes. He has no rales.  Abdominal: He exhibits distension. There is tenderness. There is rebound and guarding.  Maximal tenderness in the right lower quadrant with a positive Rovsing sign and guarding and rebound and percussion tenderness is present Scar in the supraumbilical area no sign of recurrent hernia  Musculoskeletal: Normal range of motion. He exhibits no edema or tenderness.  Lymphadenopathy:    He has no cervical adenopathy.  Neurological: He is alert and oriented to person, place, and time.  Skin: Skin is warm and dry. No rash noted. He is not diaphoretic. No erythema.  Psychiatric: Mood and affect normal.  Vitals reviewed.       Results for orders placed or performed during the hospital encounter of 01/19/16 (from the past 48 hour(s))  CBC     Status: Abnormal   Collection Time: 01/19/16 11:07 AM  Result Value Ref Range   WBC 15.5 (H) 3.8 - 10.6 K/uL   RBC 6.35 (H) 4.40 - 5.90 MIL/uL   Hemoglobin 13.3 13.0 - 18.0 g/dL   HCT 41.6 40.0 - 52.0 %   MCV 65.6 (L) 80.0 - 100.0 fL   MCH 21.0 (L) 26.0 - 34.0 pg   MCHC 32.0 32.0 - 36.0  g/dL   RDW 15.7 (H) 11.5 - 14.5 %   Platelets 146 (L) 150 - 440 K/uL  Comprehensive metabolic panel     Status: Abnormal   Collection Time: 01/19/16 11:07 AM  Result Value Ref Range   Sodium 135 135 - 145 mmol/L   Potassium 4.5 3.5 - 5.1 mmol/L   Chloride 103 101 - 111 mmol/L   CO2 26 22 - 32 mmol/L   Glucose, Bld 115 (H) 65 - 99 mg/dL   BUN 17 6 - 20 mg/dL   Creatinine, Ser 1.26 (H) 0.61 - 1.24 mg/dL   Calcium 8.6 (L) 8.9 - 10.3 mg/dL   Total Protein 7.2 6.5 - 8.1 g/dL   Albumin 3.9 3.5 - 5.0 g/dL   AST 19 15 - 41 U/L   ALT 23 17 - 63 U/L   Alkaline Phosphatase 74 38 - 126 U/L   Total Bilirubin 1.1 0.3 - 1.2 mg/dL   GFR calc non Af Amer >60 >60 mL/min   GFR calc Af Amer >60 >60 mL/min    Comment: (NOTE) The eGFR has been calculated using the CKD EPI equation. This calculation has not been validated in all clinical situations. eGFR's persistently <60 mL/min signify possible Chronic Kidney Disease.    Anion gap 6 5 - 15   Ct Abdomen Pelvis W Contrast  01/19/2016  CLINICAL DATA:  Right lower quadrant pain and fever beginning last night. Initial encounter. EXAM: CT ABDOMEN AND PELVIS WITH CONTRAST TECHNIQUE: Multidetector CT imaging of the abdomen and pelvis was performed using the standard protocol following bolus administration of intravenous contrast. CONTRAST:  100 cc Isovue 370. COMPARISON:  None. FINDINGS: Mild atelectasis is seen in the lung bases. No pleural or pericardial effusion. Calcific coronary artery disease is noted. Scattered small low attenuating lesions in the liver most consistent with cysts are identified. The liver is otherwise unremarkable. The spleen, gallbladder, adrenal glands, pancreas and kidneys appear normal. The appendix is markedly dilated with periappendiceal inflammatory change consistent with acute appendicitis. The appendix is positioned in the right mid abdomen with its tip just below the inferior most right hepatic lobe. The stomach, small bowel and  colon appear normal. There is no lymphadenopathy. Urinary bladder, prostate gland and seminal vesicles are unremarkable. No focal bony abnormality is identified. IMPRESSION: The study is positive for acute appendicitis. Calcific coronary artery disease. Critical Value/emergent results were called by telephone at the time of interpretation on 01/19/2016 at 2:23 pm to DR. Lelon Huh, who verbally acknowledged these results. Electronically Signed   By: Inge Rise M.D.   On: 01/19/2016 14:28     Assessment/Plan  CT reviewed labs reviewed leukocytosis and signs of acute appendicitis on CT scan this is correlated well with his history and physical.  I discussed with he and his wife the rationale for offering surgery the options  of antibiotic therapy and the risk of bleeding infection recurrence of symptoms failure to resolve all his symptoms conversion to an open procedure bowel injury as well as abscess from ruptured appendix he understood and agreed to proceed and is requesting surgery at this point.  Florene Glen, MD, FACS

## 2016-01-19 NOTE — ED Notes (Addendum)
RLQ pain that started yesterday. Denies NVD. Outpatient labwork done this AM and outpatient CT. Pt sent to ER for appendicitis. Fever yesterday. Pt alert and oriented X4, active, cooperative, pt in NAD. RR even and unlabored, color WNL.

## 2016-01-19 NOTE — Anesthesia Postprocedure Evaluation (Signed)
Anesthesia Post Note  Patient: [name] Grimes  Procedure(s) Performed: Procedure(s) (LRB): APPENDECTOMY LAPAROSCOPIC (N/A)  Patient location during evaluation: PACU Anesthesia Type: General Level of consciousness: awake Pain management: pain level controlled Vital Signs Assessment: post-procedure vital signs reviewed and stable Respiratory status: spontaneous breathing Cardiovascular status: blood pressure returned to baseline Anesthetic complications: no    Last Vitals:  Filed Vitals:   01/19/16 1858 01/19/16 1913  BP: 134/61 115/65  Pulse: 96 97  Temp:    Resp: 15 14    Last Pain:  Filed Vitals:   01/19/16 1915  PainSc: Asleep                 VAN STAVEREN,[name]

## 2016-01-19 NOTE — ED Notes (Signed)
Pt taken to OR by OR tech. Report given by Helmut MusterAlicia, RN to LashmeetLisa, RN

## 2016-01-19 NOTE — ED Provider Notes (Signed)
[facility] Emergency Department Provider Note    ____________________________________________  Time seen: ~1515  I have reviewed the triage vital signs and the nursing notes.   HISTORY  Chief Complaint Abdominal Pain   History limited by: Not Limited   HPI [name] MoanJoseph Grimes is a 55 y.o. male Who presents to the emergency department today because of appendicitis diagnosed on outpatient CT. Patient states that he started having abdominal pain yesterday. It has been constant since then. It is progressively gotten worse. It is located in the right lower quadrant. He denies any associated nausea or vomiting. Denies any bloody stool. Denies similar pain in the past. He did have fevers yesterday.    Past Medical History  Diagnosis Date  . Thyroid disease     Patient Active Problem List   Diagnosis Date Noted  . History of gout 12/23/2015  . H/O adenomatous polyp of colon 12/14/2015  . 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 History of smoking 12/20/2008  . Family history of cardiovascular disease 12/17/2008    Past Surgical History  Procedure Laterality Date  . Thyroidectomy Right 10/2013    Dr. Rob BuntingBennett-partial-for thyroid mass  . Tonsillectomy  1965  . Hernia repair      umbilical hernia repair. Done by Dr. Katrinka BlazingSmith    Current Outpatient Rx  Name  Route  Sig  Dispense  Refill  . aspirin 81 MG tablet   Oral   Take 81 mg by mouth daily.            Allergies Tetracycline  Family History  Problem Relation Age of Onset  . Heart attack Father     MI in his 250s  . Celiac disease Father   . Osteoporosis Brother   . Diabetes Maternal Grandfather   . Congestive Heart Failure Maternal Grandfather   . Diabetes Paternal Grandmother   . Stroke Paternal Grandmother     Social History Social History  Substance Use Topics  . Smoking status: Former Smoker -- 1.00 packs/day for 15 years    Types: Cigarettes    Quit date: 10/22/2005  . Smokeless tobacco: None   . Alcohol Use: 0.0 oz/week    0 Standard drinks or equivalent per week     Comment: drinks 2 beers on the week ends    Review of Systems  Constitutional: positive for fever. Cardiovascular: Negative for chest pain. Respiratory: Negative for shortness of breath. Gastrointestinal: positive for right lower quadrant abdominal pain Neurological: Negative for headaches, focal weakness or numbness.   10-point ROS otherwise negative.  ____________________________________________   PHYSICAL EXAM:  VITAL SIGNS: ED Triage Vitals  Enc Vitals Group     BP 01/19/16 1510 163/81 mmHg     Pulse Rate 01/19/16 1510 90     Resp 01/19/16 1510 18     Temp 01/19/16 1510 99.2 F (37.3 C)     Temp Source 01/19/16 1510 Oral     SpO2 01/19/16 1510 99 %     Weight 01/19/16 1510 219 lb (99.338 kg)     Height 01/19/16 1510 6' (1.829 m)     Head Cir --      Peak Flow --      Pain Score 01/19/16 1510 8   Constitutional: Alert and oriented. Appears mildly uncomfortable.  Eyes: Conjunctivae are normal. PERRL. Normal extraocular movements. ENT   Head: Normocephalic and atraumatic.   Nose: No congestion/rhinnorhea.   Mouth/Throat: Mucous membranes are moist.   Neck: No stridor. Hematological/Lymphatic/Immunilogical: No cervical lymphadenopathy. Cardiovascular: Normal  rate, regular rhythm.  No murmurs, rubs, or gallops. Respiratory: Normal respiratory effort without tachypnea nor retractions. Breath sounds are clear and equal bilaterally. No wheezes/rales/rhonchi. Gastrointestinal: Soft and tender to palpation in the RLQ. No rebound. No guarding. Genitourinary: Deferred Musculoskeletal: Normal range of motion in all extremities. No joint effusions.  No lower extremity tenderness nor edema. Neurologic:  Normal speech and language. No gross focal neurologic deficits are appreciated.  Skin:  Skin is warm, dry and intact. No rash noted. Psychiatric: Mood and affect are normal. Speech and  behavior are normal. Patient exhibits appropriate insight and judgment.  ____________________________________________    LABS (pertinent positives/negatives)  WBC  15.5  ____________________________________________   EKG  None  ____________________________________________    RADIOLOGY  CT abd/pel IMPRESSION: The study is positive for acute appendicitis.  Calcific coronary artery disease.  ____________________________________________   PROCEDURES  Procedure(s) performed: None  Critical Care performed: No  ____________________________________________   INITIAL IMPRESSION / ASSESSMENT AND PLAN / ED COURSE  Pertinent labs & imaging results that were available during my care of the patient were reviewed by me and considered in my medical decision making (see chart for details).  Patient presented to the emergency department today with outpatient CT positive for appendicitis. On exam here no  Physical exam findings concerning for acute abdomen however he was tender in the right lower quadrant. Surgery will be contacted.  ____________________________________________   FINAL CLINICAL IMPRESSION(S) / ED DIAGNOSES  Final diagnoses:  Acute appendicitis, unspecified acute appendicitis type     Phineas Semen, MD 01/19/16 1540

## 2016-01-19 NOTE — Progress Notes (Signed)
Patient: [name] Grimes Male    DOB: 09/28/1961   55 y.o.   MRN: [id number] Visit Date: 01/19/2016  Today's Provider: Mila Merryonald Nuri Larmer, MD   Chief Complaint  Patient presents with  . Abdominal Pain   Subjective:    Abdominal Pain This is a new problem. The current episode started yesterday. The onset quality is gradual. The problem occurs constantly. The problem has been gradually worsening. The pain is located in the RLQ. The pain is moderate. Associated symptoms include anorexia, a fever (up to 102 last night) and frequency. Pertinent negatives include no arthralgias, belching, constipation, diarrhea, dysuria, flatus, headaches, hematuria, melena, myalgias, nausea or vomiting. Associated symptoms comments: Dark colored urine. The pain is aggravated by movement and coughing (laughing also). Treatments tried: Advil. The treatment provided mild relief.  Right sided abdominal pain started after lunch yesterday (had some bar-b-q) and has been getting progressively worse ever since. He ate a piece of pizza yesterday evening which didn't have much effect on pain, but has had poor appetite. Had some chills overnight. His wife checked temperature with thermometer a few times overnight and was 101 axillary. He has taken a few doses of ibuprofen. Has not eaten this morning. He has had mild cough productive clear sputum for the last week, but has otherwise felt well until yesterday.    Allergies  Allergen Reactions  . Tetracycline Rash   Previous Medications   ASPIRIN 81 MG TABLET    Take 81 mg by mouth daily.     Review of Systems  Constitutional: Positive for fever (up to 102 last night). Negative for chills and appetite change.  Respiratory: Negative for chest tightness, shortness of breath and wheezing.   Cardiovascular: Negative for chest pain and palpitations.  Gastrointestinal: Positive for abdominal pain and anorexia. Negative for nausea, vomiting, diarrhea, constipation, melena and  flatus.  Genitourinary: Positive for frequency. Negative for dysuria, hematuria and difficulty urinating.  Musculoskeletal: Negative for myalgias and arthralgias.  Neurological: Negative for headaches.    Social History  Substance Use Topics  . Smoking status: Former Smoker -- 1.00 packs/day for 15 years    Types: Cigarettes    Quit date: 10/22/2005  . Smokeless tobacco: Not on file  . Alcohol Use: 0.0 oz/week    0 Standard drinks or equivalent per week     Comment: drinks 2 beers on the week ends   Objective:   BP 142/64 mmHg  Pulse 80  Temp(Src) 98.2 F (36.8 C) (Oral)  Resp 18  Wt 219 lb (99.338 kg)  SpO2 100%  Physical Exam   General Appearance:    Alert, cooperative, no distress  Eyes:    PERRL, conjunctiva/corneas clear, EOM's intact       Lungs:     Clear to auscultation bilaterally, respirations unlabored  Heart:    Regular rate and rhythm  Neurologic:   Awake, alert, oriented x 3. No apparent focal neurological           defect.   Abd:   Tender over ill-defined area of right mid abdomen. No rebound or guarding. Negative Cornett's sign. Slight right CVAT   Patient is unable to provide urine specimen    Assessment & Plan:     1. Right abdominal pain Not well localized to gallbladder or appendix area. Considering fever this needs to be further evaluated with abdominal imaging to rule out appendicitis and cholecystitis. Ddx includes kidney stones, viral gastroenteritis and pneumonia.  - CBC -  Comprehensive metabolic panel - CT Abd W Cm; Future (Today)  2. Fever, unspecified fever cause  - CBC - Comprehensive metabolic panel - CT Abd W Cm; Future       Mila Merry, MD  [facility]

## 2016-01-19 NOTE — Op Note (Signed)
laparascopic appendectomy   [name] MoanJoseph Curb Date of operation:  01/19/2016  Indications: The patient presented with a history of  abdominal pain. Workup has revealed findings consistent with acute appendicitis.  Pre-operative Diagnosis: Cute appendicitis  Post-operative Diagnosis: Acute appendicitis with gangrenous tip in the retrocecal position  Surgeon: Adah Salvageichard E. Excell Seltzerooper, MD, FACS  Anesthesia: General with endotracheal tube  Procedure Details  The patient was seen again in the preop area. The options of surgery versus observation were reviewed with the patient and/or family. The risks of bleeding, infection, recurrence of symptoms, negative laparoscopy, potential for an open procedure, bowel injury, abscess or infection, were all reviewed as well. The patient was taken to Operating Room, identified as [name] Grimes and the procedure verified as laparoscopic appendectomy. A Time Out was held and the above information confirmed.  The patient was placed in the supine position and general anesthesia was induced.  Antibiotic prophylaxis was administered and VT E prophylaxis was in place. A Foley catheter was placed by the nursing staff.   The abdomen was prepped and draped in a sterile fashion. An infraumbilical incision was made. A Veress needle was placed and pneumoperitoneum was obtained. A 5 mm trocar port was placed without difficulty and the abdominal cavity was explored.  Under direct vision a 5 mm suprapubic port was placed and a 13 mm left lateral port was placed all under direct vision.  The appendix was identified and found to be acutely inflamed and in a retrocecal position and could barely be identified therefore multiple additional ports were placed first one in the right lower quadrant and one in the right upper quadrant to aid in retraction of a very floppy and redundant cecum and small bowel  The appendix was carefully dissected. The base of the appendix was dissected out and  divided with a standard load Endo GIA. The mesoappendix was not prominent and was not controlled until later in the procedure. The appendix was passed out through the left lateral port site with the aid of an Endo Catch bag. The right lower quadrant and pelvis was then irrigated with copious amounts of normal saline which was aspirated. A single arterial vessel was identified and an attempt at placing 10 mm clips was performed but the 10 mm clip applier would not reach the right upper quadrant retrocecal position from the left lower quadrant 13 mm port. Therefore a 5 mm clip applier was utilized through the right upper quadrant 5 mm port to doubly clip the bleeding arterial vessel. Further irrigation confirmed that there was no other bleeding sites.  A temporal clear JP drain was brought into the 13 mm port site and brought out through the right upper quadrant 5 mm port. It was placed in the retrocecal position in the bed of the inflamed and gangrenous appendix. The catheter was then sutured in place with a 3-0 nylon.  Inspection  failed to identify any additional bleeding and there were no signs of bowel injury. Therefore the left lateral port site was closed under direct vision utilizing an Endo Close technique with 0 Vicryl interrupted sutures, all under direct vision.   Again the right upper and lower quadrant was inspected there was no sign of bleeding or bowel injury therefore pneumoperitoneum was released, all ports were removed and the skin incisions were approximated with subcuticular 4-0 Monocryl. Steri-Strips and Mastisol and sterile dressings were placed. The drain was placed to bulb suction. The patient tolerated the procedure well, there were no complications. The sponge  lap and needle count were correct at the end of the procedure.  The patient was taken to the recovery room in stable condition to be admitted for continued care.  Findings: Acute gangrenous appendicitis in a retrocecal  position  Estimated Blood Loss: 100 cc                  Specimens: appendix         Complications:  None  Drain: Single 10 mm JP drain in the right retrocecal position                  Birney Belshe E. Excell Seltzer MD, FACS

## 2016-01-19 NOTE — Progress Notes (Signed)
Called by nursing secondary to hypotension and continued bloody output from right sided JP drain. Patient is awake and conversant without tachycardia  On examination of the patient he had a saturated dressing around his JP drain consistent with a superficial bleed. No evidence of active bleed after taking the dressing down. This was replaced with a new dressing. JP output is sanguinous but appears to be decreasing.  Patient currently getting a fluid bolus for his hypotension. Plan to perform a serial exam later in the evening Discussed with nurse that should the dressing become saturated or if the drain produced more than 100 mL of continued output during her shift to call me for an additional evaluation tonight  Ricarda Frameharles Itzayanna Kaster, MD [facility] General Surgeon [facility]

## 2016-01-19 NOTE — ED Notes (Signed)
Pt sent from PCP for acute appendicitis.. Pt c/o RLQ pain for the past 2 days, had a positive CT performed today.

## 2016-01-19 NOTE — Transfer of Care (Signed)
Immediate Anesthesia Transfer of Care Note  Patient: [name] Grimes  Procedure(s) Performed: Procedure(s): APPENDECTOMY LAPAROSCOPIC (N/A)  Patient Location: PACU  Anesthesia Type:General  Level of Consciousness: sedated  Airway & Oxygen Therapy: Patient Spontanous Breathing and Patient connected to nasal cannula oxygen  Post-op Assessment: Report given to RN and Post -op Vital signs reviewed and stable  Post vital signs: Reviewed and stable  Last Vitals:  Filed Vitals:   01/19/16 1600 01/19/16 1843  BP: 158/79 159/69  Pulse: 88 105  Temp:  37.8 C  Resp: 21 17    Complications: No apparent anesthesia complications

## 2016-01-20 ENCOUNTER — Encounter
Admission: EM | Disposition: A | Discharge status: Home / Self Care | Payer: Self-pay | Source: Home / Self Care | Attending: Surgery

## 2016-01-20 ENCOUNTER — Inpatient Hospital Stay: Payer: Managed Care, Other (non HMO) | Admitting: Anesthesiology

## 2016-01-20 ENCOUNTER — Encounter: Payer: Self-pay | Admitting: Surgery

## 2016-01-20 HISTORY — PX: LAPAROSCOPY: SHX197

## 2016-01-20 HISTORY — PX: LAPAROTOMY: SHX154

## 2016-01-20 LAB — CBC
HCT: 28.6 % — ABNORMAL LOW (ref 40.0–52.0)
HCT: 25.9 % — ABNORMAL LOW (ref 40.0–52.0)
Hemoglobin: 9.1 g/dL — ABNORMAL LOW (ref 13.0–18.0)
Hemoglobin: 8.4 g/dL — ABNORMAL LOW (ref 13.0–18.0)
MCH: 20.8 pg — ABNORMAL LOW (ref 26.0–34.0)
MCH: 21.2 pg — ABNORMAL LOW (ref 26.0–34.0)
MCHC: 31.8 g/dL — ABNORMAL LOW (ref 32.0–36.0)
MCHC: 32.4 g/dL (ref 32.0–36.0)
MCV: 65.3 fL — ABNORMAL LOW (ref 80.0–100.0)
MCV: 65.5 fL — ABNORMAL LOW (ref 80.0–100.0)
Platelets: 137 10*3/uL — ABNORMAL LOW (ref 150–440)
Platelets: 136 10*3/uL — ABNORMAL LOW (ref 150–440)
RBC: 4.39 MIL/uL — ABNORMAL LOW (ref 4.40–5.90)
RBC: 3.95 MIL/uL — ABNORMAL LOW (ref 4.40–5.90)
RDW: 15.7 % — ABNORMAL HIGH (ref 11.5–14.5)
RDW: 15.2 % — ABNORMAL HIGH (ref 11.5–14.5)
WBC: 14.9 10*3/uL — ABNORMAL HIGH (ref 3.8–10.6)
WBC: 12.4 10*3/uL — ABNORMAL HIGH (ref 3.8–10.6)

## 2016-01-20 LAB — BASIC METABOLIC PANEL
Anion gap: 7 (ref 5–15)
BUN: 19 mg/dL (ref 6–20)
CO2: 23 mmol/L (ref 22–32)
Calcium: 7.5 mg/dL — ABNORMAL LOW (ref 8.9–10.3)
Chloride: 105 mmol/L (ref 101–111)
Creatinine, Ser: 1.2 mg/dL (ref 0.61–1.24)
GFR calc Af Amer: >60 mL/min (ref >60)
GFR calc non Af Amer: >60 mL/min (ref >60)
Glucose, Bld: 178 mg/dL — ABNORMAL HIGH (ref 65–99)
Potassium: 4.3 mmol/L (ref 3.5–5.1)
Sodium: 135 mmol/L (ref 135–145)

## 2016-01-20 LAB — HEMOGLOBIN AND HEMATOCRIT, BLOOD
HCT: 25.7 % — ABNORMAL LOW (ref 40.0–52.0)
Hemoglobin: 8.2 g/dL — ABNORMAL LOW (ref 13.0–18.0)

## 2016-01-20 LAB — ABO/RH: ABO/RH(D): O POS

## 2016-01-20 LAB — PREPARE RBC (CROSSMATCH)

## 2016-01-20 SURGERY — LAPAROSCOPY, DIAGNOSTIC
Anesthesia: General | Site: Abdomen | Wound class: Clean Contaminated

## 2016-01-20 MED ORDER — PHENOL 1.4 % MT LIQD
1.0000 | OROMUCOSAL | Status: DC | PRN
  Filled 2016-01-20: qty 177

## 2016-01-20 MED ORDER — MICROFIBRILLAR COLL HEMOSTAT EX POWD
CUTANEOUS | Status: AC
  Filled 2016-01-20: qty 5

## 2016-01-20 MED ORDER — LIDOCAINE HCL (CARDIAC) 20 MG/ML IV SOLN
INTRAVENOUS | Status: DC | PRN
  Administered 2016-01-20: 100 mg via INTRAVENOUS

## 2016-01-20 MED ORDER — KCL IN DEXTROSE-NACL 20-5-0.45 MEQ/L-%-% IV SOLN
INTRAVENOUS | Status: DC
  Administered 2016-01-20 – 2016-01-22 (×4): via INTRAVENOUS
  Filled 2016-01-20 (×6): qty 1000

## 2016-01-20 MED ORDER — ACETAMINOPHEN 10 MG/ML IV SOLN
INTRAVENOUS | Status: AC
  Filled 2016-01-20: qty 100

## 2016-01-20 MED ORDER — PROPOFOL 10 MG/ML IV BOLUS
INTRAVENOUS | Status: DC | PRN
  Administered 2016-01-20: 200 mg via INTRAVENOUS

## 2016-01-20 MED ORDER — PHENYLEPHRINE HCL 10 MG/ML IJ SOLN
INTRAMUSCULAR | Status: DC | PRN
  Administered 2016-01-20: 100 ug via INTRAVENOUS

## 2016-01-20 MED ORDER — ACETAMINOPHEN 325 MG PO TABS: 650.0000 mg | ORAL_TABLET | Freq: Four times a day (QID) | ORAL | Status: DC | PRN

## 2016-01-20 MED ORDER — MIDAZOLAM HCL 2 MG/2ML IJ SOLN
INTRAMUSCULAR | Status: DC | PRN
  Administered 2016-01-20: 2 mg via INTRAVENOUS

## 2016-01-20 MED ORDER — FENTANYL CITRATE (PF) 100 MCG/2ML IJ SOLN
INTRAMUSCULAR | Status: AC
  Administered 2016-01-20: 25 ug via INTRAVENOUS
  Filled 2016-01-20: qty 2

## 2016-01-20 MED ORDER — HYDROMORPHONE HCL 1 MG/ML IJ SOLN
1.0000 mg | INTRAMUSCULAR | Status: DC | PRN
  Administered 2016-01-20 – 2016-01-22 (×8): 1 mg via INTRAVENOUS
  Filled 2016-01-20 (×8): qty 1

## 2016-01-20 MED ORDER — ACETAMINOPHEN 10 MG/ML IV SOLN
INTRAVENOUS | Status: DC | PRN
  Administered 2016-01-20: 1000 mg via INTRAVENOUS

## 2016-01-20 MED ORDER — FENTANYL CITRATE (PF) 100 MCG/2ML IJ SOLN
25.0000 ug | INTRAMUSCULAR | Status: DC | PRN
  Administered 2016-01-20 (×4): 25 ug via INTRAVENOUS

## 2016-01-20 MED ORDER — DEXAMETHASONE SODIUM PHOSPHATE 10 MG/ML IJ SOLN
INTRAMUSCULAR | Status: DC | PRN
  Administered 2016-01-20: 10 mg via INTRAVENOUS

## 2016-01-20 MED ORDER — SUGAMMADEX SODIUM 200 MG/2ML IV SOLN
INTRAVENOUS | Status: DC | PRN
  Administered 2016-01-20: 200 mg via INTRAVENOUS

## 2016-01-20 MED ORDER — SODIUM CHLORIDE 0.9 % IV SOLN
INTRAVENOUS | Status: DC | PRN
Start: 2016-01-20 — End: 2016-01-20
  Administered 2016-01-20: 17:00:00 via INTRAVENOUS

## 2016-01-20 MED ORDER — FENTANYL CITRATE (PF) 100 MCG/2ML IJ SOLN
INTRAMUSCULAR | Status: DC | PRN
  Administered 2016-01-20 (×4): 50 ug via INTRAVENOUS
  Administered 2016-01-20: 100 ug via INTRAVENOUS

## 2016-01-20 MED ORDER — LIDOCAINE-EPINEPHRINE 1 %-1:100000 IJ SOLN
10.0000 mL | Freq: Once | INTRAMUSCULAR | Status: AC
  Administered 2016-01-20: 10 mL via INTRADERMAL
  Filled 2016-01-20: qty 10

## 2016-01-20 MED ORDER — ONDANSETRON HCL 4 MG/2ML IJ SOLN: 4.0000 mg | Freq: Once | INTRAMUSCULAR | Status: DC | PRN

## 2016-01-20 MED ORDER — SUCCINYLCHOLINE CHLORIDE 20 MG/ML IJ SOLN
INTRAMUSCULAR | Status: DC | PRN
  Administered 2016-01-20: 120 mg via INTRAVENOUS

## 2016-01-20 MED ORDER — ROCURONIUM BROMIDE 100 MG/10ML IV SOLN
INTRAVENOUS | Status: DC | PRN
  Administered 2016-01-20: 40 mg via INTRAVENOUS
  Administered 2016-01-20 (×3): 10 mg via INTRAVENOUS

## 2016-01-20 SURGICAL SUPPLY — 56 items
APPLIER CLIP LOGIC TI 5 (MISCELLANEOUS) ×2 IMPLANT
CANISTER SUCT 1200ML W/VALVE (MISCELLANEOUS) ×2 IMPLANT
CANNULA DILATOR 12 W/SLV (CANNULA) ×2 IMPLANT
CATH FOLEY SIL 2WAY 14FR5CC (CATHETERS) ×2 IMPLANT
CATH TRAY 16F METER LATEX (MISCELLANEOUS) ×2 IMPLANT
CLEANER CAUTERY TIP 5X5 PAD (MISCELLANEOUS) ×1 IMPLANT
DRAIN CHANNEL JP 19F (MISCELLANEOUS) ×2 IMPLANT
DRAPE INCISE IOBAN 66X45 STRL (DRAPES) ×2 IMPLANT
DRAPE LAPAROTOMY 100X77 ABD (DRAPES) ×2 IMPLANT
DRSG TEGADERM 2-3/8X2-3/4 SM (GAUZE/BANDAGES/DRESSINGS) ×2 IMPLANT
DRSG TELFA 3X8 NADH (GAUZE/BANDAGES/DRESSINGS) ×2 IMPLANT
ELECT CAUTERY NEEDLE TIP 1.0 (MISCELLANEOUS) ×4
ELECT REM PT RETURN 9FT ADLT (ELECTROSURGICAL) ×2
ELECTRODE CAUTERY NEDL TIP 1.0 (MISCELLANEOUS) ×2 IMPLANT
ELECTRODE REM PT RTRN 9FT ADLT (ELECTROSURGICAL) ×1 IMPLANT
GAUZE SPONGE 4X4 12PLY STRL (GAUZE/BANDAGES/DRESSINGS) ×2 IMPLANT
GLOVE BIO SURGEON STRL SZ7.5 (GLOVE) ×4 IMPLANT
GLOVE INDICATOR 8.0 STRL GRN (GLOVE) ×4 IMPLANT
GOWN STRL REUS W/ TWL LRG LVL3 (GOWN DISPOSABLE) ×2 IMPLANT
GOWN STRL REUS W/TWL LRG LVL3 (GOWN DISPOSABLE) ×2
HANDLE YANKAUER SUCT BULB TIP (MISCELLANEOUS) ×2 IMPLANT
HEMOSTAT APPLICATOR AVITENE (MISCELLANEOUS) ×2 IMPLANT
IRRIGATION STRYKERFLOW (MISCELLANEOUS) ×1 IMPLANT
IRRIGATOR STRYKERFLOW (MISCELLANEOUS) ×2
KIT RM TURNOVER STRD PROC AR (KITS) ×2 IMPLANT
LABEL OR SOLS (LABEL) ×2 IMPLANT
LIGASURE MARYLAND LAP STAND (ELECTROSURGICAL) IMPLANT
NEEDLE FILTER BLUNT 18X 1/2SAF (NEEDLE) ×1
NEEDLE FILTER BLUNT 18X1 1/2 (NEEDLE) ×1 IMPLANT
NS IRRIG 1000ML POUR BTL (IV SOLUTION) ×2 IMPLANT
NS IRRIG 500ML POUR BTL (IV SOLUTION) ×2 IMPLANT
PACK BASIN MAJOR ARMC (MISCELLANEOUS) ×2 IMPLANT
PACK COLON CLEAN CLOSURE (MISCELLANEOUS) IMPLANT
PACK LAP CHOLECYSTECTOMY (MISCELLANEOUS) ×2 IMPLANT
PAD CLEANER CAUTERY TIP 5X5 (MISCELLANEOUS) ×1
RETAINER VISCERA MED (MISCELLANEOUS) ×2 IMPLANT
SLEEVE ENDOPATH XCEL 5M (ENDOMECHANICALS) IMPLANT
SPONGE LAP 18X18 5 PK (GAUZE/BANDAGES/DRESSINGS) ×6 IMPLANT
SPONGE XRAY 4X4 16PLY STRL (MISCELLANEOUS) ×2 IMPLANT
STAPLER SKIN PROX 35W (STAPLE) ×4 IMPLANT
STRIP CLOSURE SKIN 1/2X4 (GAUZE/BANDAGES/DRESSINGS) ×4 IMPLANT
SUT ETHILON 5-0 FS-2 18 BLK (SUTURE) IMPLANT
SUT MAXON ABS #0 GS21 30IN (SUTURE) IMPLANT
SUT PDS AB 1 TP1 96 (SUTURE) IMPLANT
SUT SILK 3-0 (SUTURE)
SUT SILK 3-0 SH-1 18XCR BRD (SUTURE)
SUT VIC AB 3-0 SH 27 (SUTURE)
SUT VIC AB 3-0 SH 27X BRD (SUTURE) IMPLANT
SUT VICRYL+ 3-0 144IN (SUTURE) IMPLANT
SUTURE SILK 3-0 SH-1 18XCR BRD (SUTURE) IMPLANT
SYRINGE 10CC LL (SYRINGE) ×2 IMPLANT
TROCAR Z-THREAD FIOS 11X100 BL (TROCAR) ×4 IMPLANT
TROCAR Z-THREAD OPTICAL 5X100M (TROCAR) IMPLANT
TROCAR Z-THREAD SLEEVE 11X100 (TROCAR) IMPLANT
TUBING INSUFFLATOR HI FLOW (MISCELLANEOUS) ×2 IMPLANT
WATER STERILE IRR 1000ML POUR (IV SOLUTION) IMPLANT

## 2016-01-20 NOTE — Anesthesia Preprocedure Evaluation (Signed)
Anesthesia Evaluation  Patient identified by MRN, date of birth, ID band Patient awake    Reviewed: Allergy & Precautions, NPO status , Patient's Chart, lab work & pertinent test results  Airway Mallampati: II  TM Distance: >3 FB Neck ROM: Full    Dental  (+) Teeth Intact   Pulmonary sleep apnea , former smoker,    breath sounds clear to auscultation       Cardiovascular Exercise Tolerance: Good  Rhythm:Regular Rate:Tachycardia     Neuro/Psych    GI/Hepatic negative GI ROS, Neg liver ROS,   Endo/Other  negative endocrine ROS  Renal/GU negative Renal ROS     Musculoskeletal negative musculoskeletal ROS (+)   Abdominal   Peds  Hematology negative hematology ROS (+)   Anesthesia Other Findings   Reproductive/Obstetrics                             Anesthesia Physical Anesthesia Plan  ASA: II and emergent  Anesthesia Plan: General   Post-op Pain Management:    Induction: Intravenous  Airway Management Planned: Oral ETT  Additional Equipment:   Intra-op Plan:   Post-operative Plan: Extubation in OR  Informed Consent: I have reviewed the patients History and Physical, chart, labs and discussed the procedure including the risks, benefits and alternatives for the proposed anesthesia with the patient or authorized representative who has indicated his/her understanding and acceptance.     Plan Discussed with: CRNA  Anesthesia Plan Comments:         Anesthesia Quick Evaluation  

## 2016-01-20 NOTE — Progress Notes (Signed)
Dr. Tonita CongWoodham notified of hypotension, saturated dressing around JP drain and output, pain, and nausea. Orders given for 1 L bolus LR and PRN zofran. Will continue to monitor pt.

## 2016-01-20 NOTE — Progress Notes (Signed)
Patient reports feeling better  No drainage on current dressing Minimal continued drainage within JP  Will continue to follow closely  Ricarda Frameharles Rane Dumm, MD [facility] General Surgeon [facility]

## 2016-01-20 NOTE — Progress Notes (Signed)
Notified Dr. Michela PitcherEly that pt has had drainage of 170ml out of JP drain since 1015. No new orders received will continue to monitor.

## 2016-01-20 NOTE — Progress Notes (Signed)
Per Dr. Michela PitcherEly do not give aspirin due to bleeding

## 2016-01-20 NOTE — Progress Notes (Signed)
Called Dr. Tonita CongWoodham to give update. JP drained an additional 95 mL, dressing has minimal amount of marked drainage, 225 mL urine output this shift, pain is being controlled, and vital signs are stable.

## 2016-01-20 NOTE — Op Note (Signed)
01/19/2016 - 01/20/2016  6:20 PM  PATIENT:  [name] Grimes  55 y.o. male  PRE-OPERATIVE DIAGNOSIS:  bleeding  POST-OPERATIVE DIAGNOSIS:  post op hemmorhage  PROCEDURE:  Procedure(s): LAPAROSCOPY DIAGNOSTIC (N/A) EXPLORATORY LAPAROTOMY,  CONTROL POST OP HEMORRHAGE (N/A)  SURGEON:  Surgeon(s) and Role:    * Tiney Rougealph Ely III, MD - Primary   ASSISTANTS: none   ANESTHESIA:   general  EBL:  Total I/O In: 2688 [I.V.:2688] Out: 2490 [Urine:800; Drains:490; Other:1100; Blood:100]   DRAINS: (1) Jackson-Pratt drain(s) with closed bulb suction in the Right gutter   LOCAL MEDICATIONS USED:  NONE   DISPOSITION OF SPECIMEN:  None   DICTATION: .Dragon Dictation with the patient supine position and after induction of appropriate general anesthesia the patient Was prepped with ChloraPrep and draped sterile towels. A Foley catheter previously been placed. The patient was noted headdown feet up position. The suprapubic and supraumbilical incisions were reopened and cannula inserted without difficulty. The abdomen was examined. Appeared to be free blood and clot throughout the abdominal cavity. Blood extended from the pelvis all the way to the suprahepatic area. 1100 cc of blood and clot were removed. The bleeding appeared to be coming from the site of the appendiceal stump. It was difficult to visualize. A midepigastric transverse incision was made and another 11 mm port inserted under direct vision angle scope used 2 view this area. Fresh blood could be seen welling from the site but I could not identify the source. We did see the appendiceal stump person mesoappendix of the area were several clips were then placed across what appeared to be the appendiceal artery. However the blood continued well up below it and I could not visualize the source.  I chose to abandon laparoscopy at that point. We identified the colon along the anterior abdominal wall and made a transverse incision in a slightly cephalad  fashion. Incision was carried down to the subjacent tissues with electrocautery. The anterior fascia identified no platelets skin incision. The muscle was divided with Bovie electrocautery. The posterior fascia opened into the abdominal cavity. There remained significant amount of blood clot in the area of the appendix. The bowel was packed away in this area visualized. It was very difficult to see. Penicillin. The mesoappendix recently identified. One portion of what appeared to be appendiceal artery which had been previously clipped was identified but there was a second half or a branch which appeared to be bleeding and passing out. It was clipped. The area was observed for another 15 minutes and no further bleeding was identified. That cavity behind the colon with the appendix had been removed was filled with Avitene powder. Suprapubic incision was used to place a drain a 19 [place] Blake drain was inserted in the right gutter. Secured with 3-0 nylon. After appropriate sponge count the abdominal wall was then closed. The posterior layer was she closed using running 0 Maxon suture with occasional locking suture and the anterior fascia was closed with 0 Maxon interrupted figure-of-eight sutures. The skin was clipped. Surgical dressings were applied and the patient returned recovery room having tolerated procedure well. Sponge instrument and needle count were correct 2 in the operating room.  PLAN OF CARE: Admit to inpatient   PATIENT DISPOSITION:  PACU - hemodynamically stable.   Tiney Rougealph Ely III, MD

## 2016-01-20 NOTE — Transfer of Care (Signed)
Immediate Anesthesia Transfer of Care Note  Patient: [name] Grimes  Procedure(s) Performed: Procedure(s): LAPAROSCOPY DIAGNOSTIC (N/A) EXPLORATORY LAPAROTOMY,  CONTROL POST OP HEMORRHAGE (N/A)  Patient Location: PACU  Anesthesia Type:General  Level of Consciousness: sedated  Airway & Oxygen Therapy: Patient Spontanous Breathing and Patient connected to face mask oxygen  Post-op Assessment: Report given to RN and Post -op Vital signs reviewed and stable  Post vital signs: Reviewed and stable  Last Vitals:  Filed Vitals:   01/20/16 1458 01/20/16 1821  BP: 143/67 134/66  Pulse: 100 88  Temp:  37.2 C  Resp:  13    Complications: No apparent anesthesia complications

## 2016-01-20 NOTE — Progress Notes (Signed)
Still draining BRB around JP.  Seems to be coming from wound itself rather than from abdomen    Using xylocaine anesthesia and routine prep, wound reinforced with 3-0 nylon.  Redressed.  Will follow  VSS, still with good HR and BP

## 2016-01-20 NOTE — Progress Notes (Signed)
Called to see patient with persistent drainage from his JP drain. He is also draining significantly around the drain. He's put out over 250 cc in the last 90 minutes.. Repeat hemoglobin is 8.2 down from 9.1 this morning down from 13.3 preoperatively yesterday. He is hemodynamically stable but continues to drain significant amounts of blood through and around his JP drain. I have discussed the situation with the patient and his wife. In this situation I think we should return him to the operating room for laparoscopy cleanout possible laparotomy to stop the site of this bleeding. They are in agreement with this plan.

## 2016-01-20 NOTE — Progress Notes (Signed)
Notified Dr. Michela PitcherEly that pt is continuing to have increased blood loss. Per Dr. Michela PitcherEly Place order to prepare 2 units of blood. Do not place order to transfuse at this time.

## 2016-01-20 NOTE — Progress Notes (Signed)
1 Day Post-Op   Subjective:  He looks pretty good this morning. His heart rates below 100 and his blood pressure remained stable. He is ambulated morning without difficulty. He is not nauseated but continues to have significant abdominal pain. He's had another 40 cc of drainage since early this morning and there is drainage on his dressing. His hemoglobin went from 13 preoperatively to 9.1 this morning.  Vital signs in last 24 hours: Temp:  [97.8 F (36.6 C)-100.1 F (37.8 C)] 99 F (37.2 C) (03/31 0529) Pulse Rate:  [80-105] 88 (03/31 0633) Resp:  [14-22] 20 (03/31 0529) BP: (79-163)/(49-81) 125/71 mmHg (03/31 0529) SpO2:  [96 %-100 %] 97 % (03/31 0633) Weight:  [99.338 kg (219 lb)-101.061 kg (222 lb 12.8 oz)] 100.739 kg (222 lb 1.4 oz) (03/31 0324) Last BM Date: 01/19/16  Intake/Output from previous day: 03/30 0701 - 03/31 0700 In: 3988.1 [I.V.:2988.1; IV Piggyback:1000] Out: 995 [Urine:550; Drains:345; Blood:100]  GI: He has moderate abdominal discomfort some drainage on his dressing but active bowel sounds no rebound.  Lab Results:  CBC  Recent Labs  01/19/16 1107 01/20/16 0427  WBC 15.5* 14.9*  HGB 13.3 9.1*  HCT 41.6 28.6*  PLT 146* 137*   CMP     Component Value Date/Time   NA 135 01/20/2016 0427   NA 139 12/23/2015 1007   K 4.3 01/20/2016 0427   CL 105 01/20/2016 0427   CO2 23 01/20/2016 0427   GLUCOSE 178* 01/20/2016 0427   GLUCOSE 110* 12/23/2015 1007   BUN 19 01/20/2016 0427   BUN 18 12/23/2015 1007   CREATININE 1.20 01/20/2016 0427   CREATININE 1.2 03/23/2014   CALCIUM 7.5* 01/20/2016 0427   PROT 7.2 01/19/2016 1107   PROT 6.7 12/23/2015 1007   ALBUMIN 3.9 01/19/2016 1107   ALBUMIN 4.4 12/23/2015 1007   AST 19 01/19/2016 1107   ALT 23 01/19/2016 1107   ALKPHOS 74 01/19/2016 1107   BILITOT 1.1 01/19/2016 1107   BILITOT 0.7 12/23/2015 1007   GFRNONAA >60 01/20/2016 0427   GFRAA >60 01/20/2016 0427   PT/INR No results for input(s): LABPROT, INR  in the last 72 hours.  Studies/Results: Ct Abdomen Pelvis W Contrast  01/19/2016  CLINICAL DATA:  Right lower quadrant pain and fever beginning last night. Initial encounter. EXAM: CT ABDOMEN AND PELVIS WITH CONTRAST TECHNIQUE: Multidetector CT imaging of the abdomen and pelvis was performed using the standard protocol following bolus administration of intravenous contrast. CONTRAST:  100 cc Isovue 370. COMPARISON:  None. FINDINGS: Mild atelectasis is seen in the lung bases. No pleural or pericardial effusion. Calcific coronary artery disease is noted. Scattered small low attenuating lesions in the liver most consistent with cysts are identified. The liver is otherwise unremarkable. The spleen, gallbladder, adrenal glands, pancreas and kidneys appear normal. The appendix is markedly dilated with periappendiceal inflammatory change consistent with acute appendicitis. The appendix is positioned in the right mid abdomen with its tip just below the inferior most right hepatic lobe. The stomach, small bowel and colon appear normal. There is no lymphadenopathy. Urinary bladder, prostate gland and seminal vesicles are unremarkable. No focal bony abnormality is identified. IMPRESSION: The study is positive for acute appendicitis. Calcific coronary artery disease. Critical Value/emergent results were called by telephone at the time of interpretation on 01/19/2016 at 2:23 pm to DR. Mila MerryNALD FISHER, who verbally acknowledged these results. Electronically Signed   By: Drusilla Kannerhomas  Dalessio M.D.   On: 01/19/2016 14:28    Assessment/Plan: I am concerned  he continues to possibly bleed. This surgery was complicated because of his anatomy and difficult appendix. We will repeat his hemoglobin later this morning and see what his status is. If he continues to drain significantly We may want to consider laparoscopy. This plan has been discussed with the patient in detail. His wife was present for the interview.

## 2016-01-20 NOTE — Anesthesia Postprocedure Evaluation (Signed)
Anesthesia Post Note  Patient: [name] Grimes  Procedure(s) Performed: Procedure(s) (LRB): LAPAROSCOPY DIAGNOSTIC (N/A) EXPLORATORY LAPAROTOMY,  CONTROL POST OP HEMORRHAGE (N/A)  Patient location during evaluation: PACU Anesthesia Type: General Level of consciousness: awake and alert Pain management: pain level controlled Vital Signs Assessment: post-procedure vital signs reviewed and stable Respiratory status: spontaneous breathing and respiratory function stable Cardiovascular status: stable Anesthetic complications: no    Last Vitals:  Filed Vitals:   01/20/16 2011 01/20/16 2128  BP: 147/74 145/68  Pulse: 88 86  Temp: 36.7 C 37.1 C  Resp: 18 18    Last Pain:  Filed Vitals:   01/20/16 2231  PainSc: 7                  Trinetta Alemu K

## 2016-01-20 NOTE — Anesthesia Procedure Notes (Signed)
Procedure Name: Intubation Date/Time: 01/20/2016 4:09 PM Performed by: Lily KocherPERALTA, [name] Grimes Pre-anesthesia Checklist: Patient identified, Patient being monitored, Timeout performed, Emergency Drugs available and Suction available Patient Re-evaluated:Patient Re-evaluated prior to inductionOxygen Delivery Method: Circle system utilized Preoxygenation: Pre-oxygenation with 100% oxygen Intubation Type: IV induction Ventilation: Mask ventilation without difficulty Laryngoscope Size: Mac and 3 Grade View: Grade III Tube type: Oral Tube size: 7.5 mm Number of attempts: 1 Airway Equipment and Method: Stylet Placement Confirmation: ETT inserted through vocal cords under direct vision,  positive ETCO2 and breath sounds checked- equal and bilateral Secured at: 22 cm Tube secured with: Tape Dental Injury: Teeth and Oropharynx as per pre-operative assessment

## 2016-01-21 LAB — CBC
HCT: 24.9 % — ABNORMAL LOW (ref 40.0–52.0)
Hemoglobin: 8 g/dL — ABNORMAL LOW (ref 13.0–18.0)
MCH: 21 pg — ABNORMAL LOW (ref 26.0–34.0)
MCHC: 32.2 g/dL (ref 32.0–36.0)
MCV: 65.2 fL — ABNORMAL LOW (ref 80.0–100.0)
Platelets: 156 10*3/uL (ref 150–440)
RBC: 3.82 MIL/uL — ABNORMAL LOW (ref 4.40–5.90)
RDW: 15.2 % — ABNORMAL HIGH (ref 11.5–14.5)
WBC: 12.2 10*3/uL — ABNORMAL HIGH (ref 3.8–10.6)

## 2016-01-21 LAB — BASIC METABOLIC PANEL
Anion gap: 1 — ABNORMAL LOW (ref 5–15)
BUN: 20 mg/dL (ref 6–20)
CO2: 29 mmol/L (ref 22–32)
Calcium: 7.3 mg/dL — ABNORMAL LOW (ref 8.9–10.3)
Chloride: 104 mmol/L (ref 101–111)
Creatinine, Ser: 1.06 mg/dL (ref 0.61–1.24)
GFR calc Af Amer: >60 mL/min (ref >60)
GFR calc non Af Amer: >60 mL/min (ref >60)
Glucose, Bld: 154 mg/dL — ABNORMAL HIGH (ref 65–99)
Potassium: 4.4 mmol/L (ref 3.5–5.1)
Sodium: 134 mmol/L — ABNORMAL LOW (ref 135–145)

## 2016-01-21 NOTE — Progress Notes (Signed)
1 Day Post-Op   Subjective:  He feels "100% better". He's been up ambulating without difficulty. He is not nauseated. He would like to try some liquids. His hemoglobin remained stable at 8. He has had much less drainage from his JP drain over the last 12 hours.  Vital signs in last 24 hours: Temp:  [97.8 F (36.6 C)-99.9 F (37.7 C)] 98.3 F (36.8 C) (04/01 0510) Pulse Rate:  [78-101] 98 (04/01 0821) Resp:  [12-24] 18 (04/01 0510) BP: (121-148)/(61-74) 133/61 mmHg (04/01 0510) SpO2:  [91 %-100 %] 99 % (04/01 0821) Last BM Date: 02/19/16  Intake/Output from previous day: 03/31 0701 - 04/01 0700 In: 3925.5 [I.V.:3925.5] Out: 5090 [Urine:2950; Drains:940; Blood:100]  GI: Abdomen is soft with mild distention moderate incisional tenderness and fair bowel sounds.  Lab Results:  CBC  Recent Labs  01/20/16 2029 01/21/16 0527  WBC 12.4* 12.2*  HGB 8.4* 8.0*  HCT 25.9* 24.9*  PLT 136* 156   CMP     Component Value Date/Time   NA 134* 01/21/2016 0527   NA 139 12/23/2015 1007   K 4.4 01/21/2016 0527   CL 104 01/21/2016 0527   CO2 29 01/21/2016 0527   GLUCOSE 154* 01/21/2016 0527   GLUCOSE 110* 12/23/2015 1007   BUN 20 01/21/2016 0527   BUN 18 12/23/2015 1007   CREATININE 1.06 01/21/2016 0527   CREATININE 1.2 03/23/2014   CALCIUM 7.3* 01/21/2016 0527   PROT 7.2 01/19/2016 1107   PROT 6.7 12/23/2015 1007   ALBUMIN 3.9 01/19/2016 1107   ALBUMIN 4.4 12/23/2015 1007   AST 19 01/19/2016 1107   ALT 23 01/19/2016 1107   ALKPHOS 74 01/19/2016 1107   BILITOT 1.1 01/19/2016 1107   BILITOT 0.7 12/23/2015 1007   GFRNONAA >60 01/21/2016 0527   GFRAA >60 01/21/2016 0527   PT/INR No results for input(s): LABPROT, INR in the last 72 hours.  Studies/Results: Ct Abdomen Pelvis W Contrast  01/19/2016  CLINICAL DATA:  Right lower quadrant pain and fever beginning last night. Initial encounter. EXAM: CT ABDOMEN AND PELVIS WITH CONTRAST TECHNIQUE: Multidetector CT imaging of the  abdomen and pelvis was performed using the standard protocol following bolus administration of intravenous contrast. CONTRAST:  100 cc Isovue 370. COMPARISON:  None. FINDINGS: Mild atelectasis is seen in the lung bases. No pleural or pericardial effusion. Calcific coronary artery disease is noted. Scattered small low attenuating lesions in the liver most consistent with cysts are identified. The liver is otherwise unremarkable. The spleen, gallbladder, adrenal glands, pancreas and kidneys appear normal. The appendix is markedly dilated with periappendiceal inflammatory change consistent with acute appendicitis. The appendix is positioned in the right mid abdomen with its tip just below the inferior most right hepatic lobe. The stomach, small bowel and colon appear normal. There is no lymphadenopathy. Urinary bladder, prostate gland and seminal vesicles are unremarkable. No focal bony abnormality is identified. IMPRESSION: The study is positive for acute appendicitis. Calcific coronary artery disease. Critical Value/emergent results were called by telephone at the time of interpretation on 01/19/2016 at 2:23 pm to DR. Mila MerryNALD FISHER, who verbally acknowledged these results. Electronically Signed   By: Drusilla Kannerhomas  Dalessio M.D.   On: 01/19/2016 14:28    Assessment/Plan: He seems to be improving postsurgery. His hemoglobin is stable and he is hemodynamically stable. His drainage has decreased dramatically. Overall he appears to be much improved since his laparotomy yesterday. I once again discussed the situation with him in detail and they appear to have a  good understanding of the problem. We will slowly advance his diet and his activity level and recheck his hemoglobin.

## 2016-01-22 LAB — CBC
HCT: 23.1 % — ABNORMAL LOW (ref 40.0–52.0)
Hemoglobin: 7.4 g/dL — ABNORMAL LOW (ref 13.0–18.0)
MCH: 21.2 pg — ABNORMAL LOW (ref 26.0–34.0)
MCHC: 32.2 g/dL (ref 32.0–36.0)
MCV: 65.8 fL — ABNORMAL LOW (ref 80.0–100.0)
Platelets: 185 10*3/uL (ref 150–440)
RBC: 3.51 MIL/uL — ABNORMAL LOW (ref 4.40–5.90)
RDW: 15.4 % — ABNORMAL HIGH (ref 11.5–14.5)
WBC: 8.9 10*3/uL (ref 3.8–10.6)

## 2016-01-22 LAB — BASIC METABOLIC PANEL
Anion gap: 3 — ABNORMAL LOW (ref 5–15)
BUN: 16 mg/dL (ref 6–20)
CO2: 26 mmol/L (ref 22–32)
Calcium: 7.5 mg/dL — ABNORMAL LOW (ref 8.9–10.3)
Chloride: 108 mmol/L (ref 101–111)
Creatinine, Ser: 1.31 mg/dL — ABNORMAL HIGH (ref 0.61–1.24)
GFR calc Af Amer: >60 mL/min (ref >60)
GFR calc non Af Amer: 59 mL/min — ABNORMAL LOW (ref >60)
Glucose, Bld: 102 mg/dL — ABNORMAL HIGH (ref 65–99)
Potassium: 3.7 mmol/L (ref 3.5–5.1)
Sodium: 137 mmol/L (ref 135–145)

## 2016-01-22 NOTE — Progress Notes (Signed)
AVSS. 3500 cc urine output last 24 hours. Passing gas. No nausea. Modest pain well controlled w/ po meds. HGB down from 8.0 to 7.4 last 24 hours.  Drain: Sero sang. Wounds: Clean. Lungs: Clear. Using Inspirex at 2000 +. Cardio: RR. ABD: Moderate distension, soft, BS +. IMP: Doing well. Will hold diet advance with abdominal distension. Encouraged continued ambulation which he is doing well.   Initial operative report described a gangrenous appendix.  Presently on Mefoxin 1 gm a6h.

## 2016-01-22 NOTE — Progress Notes (Signed)
Pt.'s JP drain has had a total of 190 ml for the last 12 hours, with output having a bloody appearance. Will continue to monitor pt.   [name]

## 2016-01-22 NOTE — Progress Notes (Signed)
Pt has ambulated five times around the nursing station, a total of 800 feet. Pt tolerated ambulation very well. Will continue to encourage ambulation.  [name] Grimes

## 2016-01-23 ENCOUNTER — Encounter: Payer: Self-pay | Admitting: Surgery

## 2016-01-23 LAB — CBC
HCT: 25 % — ABNORMAL LOW (ref 40.0–52.0)
Hemoglobin: 8.1 g/dL — ABNORMAL LOW (ref 13.0–18.0)
MCH: 20.7 pg — ABNORMAL LOW (ref 26.0–34.0)
MCHC: 32.2 g/dL (ref 32.0–36.0)
MCV: 64.4 fL — ABNORMAL LOW (ref 80.0–100.0)
Platelets: 218 10*3/uL (ref 150–440)
RBC: 3.89 MIL/uL — ABNORMAL LOW (ref 4.40–5.90)
RDW: 15.5 % — ABNORMAL HIGH (ref 11.5–14.5)
WBC: 7.8 10*3/uL (ref 3.8–10.6)

## 2016-01-23 LAB — SURGICAL PATHOLOGY

## 2016-01-23 MED ORDER — CIPROFLOXACIN HCL 500 MG PO TABS: 500.0000 mg | ORAL_TABLET | Freq: Two times a day (BID) | ORAL | Status: DC

## 2016-01-23 MED ORDER — METRONIDAZOLE 500 MG PO TABS: 500.0000 mg | ORAL_TABLET | Freq: Three times a day (TID) | ORAL | Status: DC

## 2016-01-23 MED ORDER — OXYCODONE HCL 5 MG PO TABS: 5.0000 mg | ORAL_TABLET | ORAL | Status: DC | PRN

## 2016-01-23 NOTE — Progress Notes (Signed)
Pt d/c to home today.  JP drain to remain intact.  Education provided on how to engage and disengage drain.  Pt performed teach back successfully.  Educated pt to record output and color of drainage.  Instructed pt to take output data to [facility].  IV removed intact.  Rx's given to pt w/all questions and concerns addressed.  D/C paperwork reviewed with education provided on s/s of infection and all questions and concerns addressed.  Pt wife at bedside for home transport.  Volunteer services contact for transportation from room to exit.

## 2016-01-23 NOTE — Discharge Instructions (Signed)
Exploratory Laparotomy, Adult  Exploratory laparotomy is a surgical procedure to examine the organs inside your belly (abdomen). Another name for this is abdominal exploration. You may have this procedure if you have abdominal pain, trauma, bleeding, infection, or obstruction. The procedure may be done if your health care provider cannot make a diagnosis from only an exam and testing.  Exploratory laparotomy may be a planned procedure or an emergency procedure. You may have surgical treatment as part of the laparotomy, or you may have additional treatment after your laparotomy. This will depend on what your surgeon finds during the procedure.  LET YOUR HEALTH CARE PROVIDER KNOW ABOUT:   Any allergies you have.   All medicines you are taking, including vitamins, herbs, eye drops, creams, and over-the-counter medicines.   Previous problems you or members of your family have had with the use of anesthetics.   Any blood disorders you have.   Previous surgeries you have had.   Medical conditions you have.  RISKS AND COMPLICATIONS  Generally, this is a safe procedure. However, problems can occur and include:   Bleeding.   Infection.   A blood clot that forms in your leg and travels to your lungs.   Damage to organs inside your abdomen.   Scar tissue that blocks your digestive tract.  BEFORE THE PROCEDURE   Ask your health care provider about:   Changing or stopping your regular medicines. This is especially important if you are taking diabetes medicines or blood thinners.   Taking medicines such as aspirin and ibuprofen. These medicines can thin your blood. Do not take these medicines before your procedure if your health care provider instructs you not to.   Do not eat or drink anything after midnight on the night before the procedure or as directed by your health care provider.   You may be given instructions for clearing out your bowel before surgery (bowel prep). If you are already in the hospital, the  bowel prep may be done there.  PROCEDURE   An IV tube may be inserted into a vein. You may receive fluids and medicine through the IV tube. This may include antibiotic medicine to treat or prevent infection.   You will be given a medicine that makes you go to sleep (general anesthetic).   You may have a tube placed through your nose and into your stomach (nasogastric tube) to drain your stomach fluids.   You may have a tube placed into your bladder (urinary catheter) to drain urine.   Your abdomen will be cleaned with a germ-killing solution (antiseptic).   The surgeon will make a surgical cut (incision) in your abdomen. This is usually an up-and-down incision in the midsection of your abdomen. The incision will go through the inside lining of your abdomen (peritoneum).   Your surgeon will spread the incision wide enough to examine the inside of your abdomen.   The rest of the procedure will depend on what the surgeon finds:    The surgeon will check all organs in your abdomen for damage or obstruction. Repairs will be made when possible.    If there is blood in the abdomen, the surgeon will look for the source of the bleeding in order to stop it.    If there is yellowish-white fluid (pus) or gastric fluids in your abdomen, the surgeon will check for an infection or a hole (perforation) in your digestive tract.    If the surgeon finds infection, a drain may be   placed to empty fluid that can build up in your abdomen after surgery.    If there is a growth (tumor) inside your abdomen, the surgeon may remove a piece of the growth (biopsy) to examine it under a microscope.   When all procedures are complete, the surgeon will close your abdomen with layers of stitches (sutures).   The incision through the skin of your abdomen will be closed with sutures or staples.  AFTER THE PROCEDURE   Your blood pressure, heart rate, breathing rate, and blood oxygen level will be monitored often until the medicines you were  given have worn off.   You will continue to receive fluids and nutrition through your IV tube. This will stop when you can eat and drink on your own.   You may also get antibiotic medicine and pain medicine through your IV tube.   Your nasogastric tube may be removed when you start to pass gas.   Your urinary catheter may be removed when the anesthetic wears off.     This information is not intended to replace advice given to you by your health care provider. Make sure you discuss any questions you have with your health care provider.     Document Released: 07/03/2001 Document Revised: 10/29/2014 Document Reviewed: 05/26/2014  Elsevier Interactive Patient Education 2016 [employer]

## 2016-01-23 NOTE — Final Progress Note (Signed)
Patient ID: [name] Grimes MRN: [id number] DOB/AGE: 57/06/1961 57 y.o.  Admit date: 01/19/2016 Discharge date: 01/23/2016   Discharge Diagnoses:  Active Problems:   Acute gangrenous appendicitis   Procedures:  1. Laparoscopic appendectomy 3/30 2. Laparotomy for control of bleeding 3/31  Hospital Course: 55 -year-old gentleman on the abdominal pain increasing white count and CT findings consistent with appendicitis Dr. Excell Seltzerooper and perform an laparoscopic appendectomy this case was challenging because the appendix was retrocecal and there was significant inflammatory response. Patient did well postoperatively and on postoperative day #1and filling up his JP drain was brought and his hemoglobin went from 13-8.2. There was significant output from the JP drain. The patient was taken back to the operating room by Dr. Michela PitcherEly initially for diagnostic laparoscopy there was significant hemoperitoneum with more than a liter of blood in the abdomen. There was difficulty in controlling the bleeding laparoscopically and convert to an open transverse laparotomy incision. He was able to control bleeding from one of the branches of the appendiceal artery that was bleeding. Postoperatively she did well he did require blood transfusion and there was an adequate response of his hemoglobin to the block. At the time of discharge he was ambulating, tolerating regular diet, his abdomen was soft, and incisions were clean dry and intact with staples in place and a JP with mostly serosanguineous drainage.  Discussed with the patient in detail about keeping one more day versus going home. He feels that he is radiating to go home. He has ambulated. I actually went back to see me in the afternoon as a matter of fact he was being inflated well and was ambulating the halls and with no evidence of any complications. The patient will be sent home with a JP drain and a follow-up appointment with acid next week. Counseling  provided   Disposition: Final discharge disposition not confirmed  Discharge Instructions    Call MD for:  difficulty breathing, headache or visual disturbances    Complete by:  As directed      Call MD for:  extreme fatigue    Complete by:  As directed      Call MD for:  hives    Complete by:  As directed      Call MD for:  persistant dizziness or light-headedness    Complete by:  As directed      Call MD for:  persistant nausea and vomiting    Complete by:  As directed      Call MD for:  redness, tenderness, or signs of infection (pain, swelling, redness, odor or green/yellow discharge around incision site)    Complete by:  As directed      Call MD for:  severe uncontrolled pain    Complete by:  As directed      Call MD for:  temperature >100.4    Complete by:  As directed      Change dressing (specify)    Complete by:  As directed   Please empty JP BID, measure 24 hr output. May shower daily     Diet - low sodium heart healthy    Complete by:  As directed      Increase activity slowly    Complete by:  As directed      Lifting restrictions    Complete by:  As directed   20 lbs            Medication List    STOP taking these medications  aspirin 81 MG tablet      TAKE these medications        ciprofloxacin 500 MG tablet  Commonly known as:  CIPRO  Take 1 tablet (500 mg total) by mouth 2 (two) times daily.     metroNIDAZOLE 500 MG tablet  Commonly known as:  FLAGYL  Take 1 tablet (500 mg total) by mouth 3 (three) times daily.     oxyCODONE 5 MG immediate release tablet  Commonly known as:  Oxy IR/ROXICODONE  Take 1 tablet (5 mg total) by mouth every 4 (four) hours as needed for moderate pain.           Follow-up Information    Follow up with [facility] In 1 week.   Why:  For wound re-check   Contact information:   [address] [place] [address] [phone]       Sterling Big, MD FACS

## 2016-01-24 LAB — TYPE AND SCREEN
ABO/RH(D): O POS
Antibody Screen: NEGATIVE
Unit division: 0
Unit division: 0

## 2016-01-30 ENCOUNTER — Encounter: Payer: Self-pay | Admitting: Surgery

## 2016-01-30 ENCOUNTER — Ambulatory Visit (INDEPENDENT_AMBULATORY_CARE_PROVIDER_SITE_OTHER): Payer: Managed Care, Other (non HMO) | Admitting: Surgery

## 2016-01-30 VITALS — BP 139/77 | HR 82 | Temp 96.3°F | Ht 72.0 in | Wt 207.0 lb

## 2016-01-30 DIAGNOSIS — K353 Acute appendicitis with localized peritonitis, without perforation or gangrene: Secondary | ICD-10-CM

## 2016-01-30 NOTE — Patient Instructions (Signed)

## 2016-01-30 NOTE — Progress Notes (Signed)
Patient feels well after laparoscopic appendectomy for retrocecal appendix with gangrenous tip appendicitis. He did require an emergency laparoscopy converted to open procedure by Dr. Michela PitcherEly 24 hours later due to postoperative hemorrhage. Feeling well at this time and wants to go back to work.  Wounds are healing well no erythema no drainage half the staples are been removed and Steri-Strips placed by the RN staff. Serous drainage is noted in the JP drain and the JP drain is removed. Calves are nontender  Patient doing very well recommend follow-up in 1 week for removal of the remainder of his staples and 1 last nylon suture in the suprapubic area

## 2016-02-01 ENCOUNTER — Telehealth: Payer: Self-pay

## 2016-02-01 NOTE — Telephone Encounter (Signed)
Patient's wife picked up note at this time.

## 2016-02-01 NOTE — Telephone Encounter (Signed)
Patient's wife called in and states that he will need a return to work note for Monday 02/06/16. We talked about this in his appointment earlier this week and that he would need to remain on lifting restrictions after returning to work. She is fully aware of this. I explained that I would have note ready at front desk this afternoon.  She will be up to pick this note up this afternoon.

## 2016-02-07 ENCOUNTER — Encounter: Payer: Self-pay | Admitting: General Surgery

## 2016-02-07 ENCOUNTER — Ambulatory Visit (INDEPENDENT_AMBULATORY_CARE_PROVIDER_SITE_OTHER): Payer: Managed Care, Other (non HMO) | Admitting: General Surgery

## 2016-02-07 VITALS — BP 131/82 | HR 79 | Temp 98.1°F | Ht 72.0 in | Wt 202.8 lb

## 2016-02-07 DIAGNOSIS — Z4889 Encounter for other specified surgical aftercare: Secondary | ICD-10-CM

## 2016-02-07 NOTE — Progress Notes (Signed)
Outpatient Surgical Follow Up  02/07/2016  [name] Grimes is an 55 y.o. male.   Chief Complaint  Patient presents with  . Routine Post Op    Laparoscopic Appendectomy (01/19/16)- Dr. Excell Seltzerooper    HPI: 55 year old male returns to clinic for second follow-up after complicated appendectomy. Patient reports doing well. He is denying any pain and has return to work. He is here today primarily to have his final Staples removed. He denies any fevers, chills, nausea, vomiting, diarrhea, constipation, abdominal pain, or any other complaint. He's been very happy with his surgical care.  Past Medical History  Diagnosis Date  . Thyroid disease   . Appendicitis     Past Surgical History  Procedure Laterality Date  . Thyroidectomy Right 10/2013    Dr. Rob BuntingBennett-partial-for thyroid mass  . Tonsillectomy  1965  . Hernia repair      umbilical hernia repair. Done by Dr. Katrinka BlazingSmith  . Laparoscopic appendectomy N/A 01/19/2016    Procedure: APPENDECTOMY LAPAROSCOPIC;  Surgeon: Lattie Hawichard E Cooper, MD;  Location: [facility];  Service: General;  Laterality: N/A;  . Laparoscopy N/A 01/20/2016    Procedure: LAPAROSCOPY DIAGNOSTIC;  Surgeon: Tiney Rougealph Ely III, MD;  Location: [facility] ORS;  Service: General;  Laterality: N/A;  . Laparotomy N/A 01/20/2016    Procedure: EXPLORATORY LAPAROTOMY,  CONTROL POST OP HEMORRHAGE;  Surgeon: Tiney Rougealph Ely III, MD;  Location: [facility] ORS;  Service: General;  Laterality: N/A;    Family History  Problem Relation Age of Onset  . Heart attack Father     MI in his 5750s  . Celiac disease Father   . Osteoporosis Brother   . Diabetes Maternal Grandfather   . Congestive Heart Failure Maternal Grandfather   . Diabetes Paternal Grandmother   . Stroke Paternal Grandmother     Social History:  reports that he quit smoking about 10 years ago. His smoking use included Cigarettes. He has a 15 pack-year smoking history. He has never used smokeless tobacco. He reports that he drinks alcohol. He reports that he  does not use illicit drugs.  Allergies:  Allergies  Allergen Reactions  . Tetracycline Rash    Medications reviewed.    ROS A multipoint review of systems was completed, all pertinent positives and negatives are documented in the history of present illness and remainder are negative.   BP 131/82 mmHg  Pulse 79  Temp(Src) 98.1 F (36.7 C) (Oral)  Ht 6' (1.829 m)  Wt 91.989 kg (202 lb 12.8 oz)  BMI 27.50 kg/m2  Physical Exam Gen.: No acute distress Chest: Clear to auscultation Heart: Regular rate and rhythm Abdomen: Soft, nontender, nondistended. Well approximated transverse abdominal incision and laparoscopic incision sites. No evidence of erythema or drainage. Staples in place alternated with Steri-Strips.    No results found for this or any previous visit (from the past 48 hour(s)). No results found.  Assessment/Plan:  1. Aftercare following surgery Patient doing very well status post a complicated appendectomy. Remaining staples and sutures removed and replaced with Steri-Strips today. Provided with signs and symptoms of infection or other, location and return to clinic immediately should they occur. Otherwise he'll follow up as needed.     Ricarda Frameharles Ryenn Howeth, MD FACS General Surgeon  02/07/2016,4:30 PM

## 2016-02-07 NOTE — Patient Instructions (Signed)

## 2016-09-04 ENCOUNTER — Ambulatory Visit (INDEPENDENT_AMBULATORY_CARE_PROVIDER_SITE_OTHER): Payer: Managed Care, Other (non HMO) | Admitting: Physician Assistant

## 2016-09-04 ENCOUNTER — Encounter: Payer: Self-pay | Admitting: Physician Assistant

## 2016-09-04 VITALS — BP 158/72 | HR 56 | Temp 98.3°F | Resp 16 | Wt 214.0 lb

## 2016-09-04 DIAGNOSIS — K429 Umbilical hernia without obstruction or gangrene: Secondary | ICD-10-CM

## 2016-09-04 DIAGNOSIS — T148XXA Other injury of unspecified body region, initial encounter: Secondary | ICD-10-CM | POA: Diagnosis not present

## 2016-09-04 NOTE — Patient Instructions (Signed)
Musculoskeletal Pain  Musculoskeletal pain is muscle and bone aches and pains. This pain can occur in any part of the body.  Follow these instructions at home:  · Only take medicines for pain, discomfort, or fever as told by your health care provider.  · You may continue all activities unless the activities cause more pain. When the pain lessens, slowly resume normal activities. Gradually increase the intensity and duration of the activities or exercise.  · During periods of severe pain, bed rest may be helpful. Lie or sit in any position that is comfortable, but get out of bed and walk around at least every several hours.  · If directed, put ice on the injured area.  ? Put ice in a plastic bag.  ? Place a towel between your skin and the bag.  ? Leave the ice on for 20 minutes, 2-3 times a day.  Contact a health care provider if:  · Your pain is getting worse.  · Your pain is not relieved with medicines.  · You lose function in the area of the pain if the pain is in your arms, legs, or neck.  This information is not intended to replace advice given to you by your health care provider. Make sure you discuss any questions you have with your health care provider.  Document Released: 10/08/2005 Document Revised: 03/20/2016 Document Reviewed: 06/12/2013  Elsevier Interactive Patient Education © 2017 [employer] Inc[employer]

## 2016-09-04 NOTE — Progress Notes (Signed)
Patient: [name] Grimes Male    DOB: 09/15/1961   55 y.o.   MRN: [id number] Visit Date: 09/04/2016  Today's Provider: Trey SailorsAdriana M Pollak, PA-C   Chief Complaint  Patient presents with  . Abdominal Pain  . Hip Pain   Subjective:    Abdominal Pain  This is a new problem. The current episode started 1 to 4 weeks ago. The pain is located in the RLQ. The pain is at a severity of 4/10 (Has been as high at 8/10). The quality of the pain is aching and sharp. The abdominal pain radiates to the back. Associated symptoms include myalgias. Pertinent negatives include no arthralgias, constipation, diarrhea, headaches, nausea or vomiting. Treatments tried: Ibuprofen, heat.  The treatment provided mild relief.  Hip Pain   The incident occurred more than 1 week ago. The pain is present in the right hip.    Patient is a 55 year old man with history of appendectomy who comes in today complaining of lower right sided abdominal pain/hip pain.  Pt says it started about two weeks ago while running a 5k. He says after the first mile he felt something pull in his right side and afterward had difficulty walking. He had trouble lifting his right leg to go up the stairs. Since then, pain has continued in his RLQ near his hip bone. It does not radiate to his groin. He reports normal bowel and bladder function. No nausea, vomiting, diarrhea. No history of injury to this area. Pain worse with reaching forward and stretching certain ways. Pain remains as an ache. Patient reports he is comparitively better. Took some ibuprofen for a couple of days. No history of kidney stones.   Patient also c/o periumbilical mass near surgical incision site. Protruding mass, no pain. No bowel dysfunction.     Allergies  Allergen Reactions  . Tetracycline Rash    No current outpatient prescriptions on file.  Review of Systems  Constitutional: Negative.   Gastrointestinal: Positive for abdominal distention and abdominal pain.  Negative for anal bleeding, blood in stool, constipation, diarrhea, nausea, rectal pain and vomiting.  Musculoskeletal: Positive for joint swelling and myalgias. Negative for arthralgias, back pain, gait problem, neck pain and neck stiffness.  Neurological: Negative for dizziness, light-headedness and headaches.    Social History  Substance Use Topics  . Smoking status: Former Smoker    Packs/day: 1.00    Years: 15.00    Types: Cigarettes    Quit date: 10/22/2005  . Smokeless tobacco: Never Used  . Alcohol use 0.0 oz/week     Comment: drinks 2 beers on the week ends   Objective:   BP (!) 158/72 (BP Location: Left Arm, Patient Position: Sitting, Cuff Size: Normal)   Pulse (!) 56   Temp 98.3 F (36.8 C) (Oral)   Resp 16   Wt 214 lb (97.1 kg)   BMI 29.02 kg/m   Physical Exam  Constitutional: He is oriented to person, place, and time. He appears well-developed and well-nourished.  Cardiovascular: Normal rate and regular rhythm.   Pulmonary/Chest: Effort normal and breath sounds normal.  Abdominal: Soft. Bowel sounds are normal. He exhibits mass. He exhibits no distension. There is no tenderness. There is no rebound, no guarding and no CVA tenderness. A hernia is present.    Periumbilical protruding mass near appendectomy incision site. Reducible.   Musculoskeletal: Normal range of motion. He exhibits tenderness. He exhibits no edema or deformity.  Right hip: He exhibits tenderness.       Left hip: Normal.  Some tenderness to palpation in right hip area. Normal strength in bilateral lower extremities. Sensations grossly intact BLE.   Neurological: He is alert and oriented to person, place, and time. Gait normal.  Skin: Skin is warm and dry.  Psychiatric: He has a normal mood and affect. His behavior is normal.        Assessment & Plan:      Problem List Items Addressed This Visit    None    Visit Diagnoses    Periumbilical hernia    -  Primary   Relevant Orders    Ambulatory referral to General Surgery   Musculoskeletal strain         Patient is 55 y/o male presenting with right lower quadrant pain. Patient feeling better comparatively. Normal exam in office today, no gait dysfunction. Patient may take ibuprofen 400 mg Q6H consistently x1 week. Declines Pt referral today but may call back if he changes mind.   Have referred to general surgery for likely periumbilical hernia.   Return if symptoms worsen or fail to improve.   Patient Instructions  Musculoskeletal Pain Musculoskeletal pain is muscle and bone aches and pains. This pain can occur in any part of the body. Follow these instructions at home:  Only take medicines for pain, discomfort, or fever as told by your health care provider.  You may continue all activities unless the activities cause more pain. When the pain lessens, slowly resume normal activities. Gradually increase the intensity and duration of the activities or exercise.  During periods of severe pain, bed rest may be helpful. Lie or sit in any position that is comfortable, but get out of bed and walk around at least every several hours.  If directed, put ice on the injured area.  Put ice in a plastic bag.  Place a towel between your skin and the bag.  Leave the ice on for 20 minutes, 2-3 times a day. Contact a health care provider if:  Your pain is getting worse.  Your pain is not relieved with medicines.  You lose function in the area of the pain if the pain is in your arms, legs, or neck. This information is not intended to replace advice given to you by your health care provider. Make sure you discuss any questions you have with your health care provider. Document Released: 10/08/2005 Document Revised: 03/20/2016 Document Reviewed: 06/12/2013 Elsevier Interactive Patient Education  2017 [employer]   The entirety of the information documented in the History of Present Illness, Review of Systems and Physical  Exam were personally obtained by me. Portions of this information were initially documented by Kavin LeechLaura Walsh, CMA and reviewed by me for thoroughness and accuracy.         Trey SailorsAdriana M Pollak, PA-C  [facility]

## 2016-11-28 IMAGING — CT CT ABD-PELV W/ CM
1 of 3 series · 14 of 32 positions shown, 19 images · IV contrast (APPLIED)
Comparison: None.

CLINICAL DATA: Right lower quadrant pain and fever beginning last
night. Initial encounter.

EXAM:
CT ABDOMEN AND PELVIS WITH CONTRAST
TECHNIQUE: Multidetector CT imaging of the abdomen and pelvis was performed
using the standard protocol following bolus administration of
intravenous contrast.
CONTRAST:  100 cc Isovue 370.

[Series 2: axial st · axial · 0.82mm/px · z∈[-1040,-560]mm · 14 of 108 slices shown, 19 images]
[im 6/108  soft-tissue]
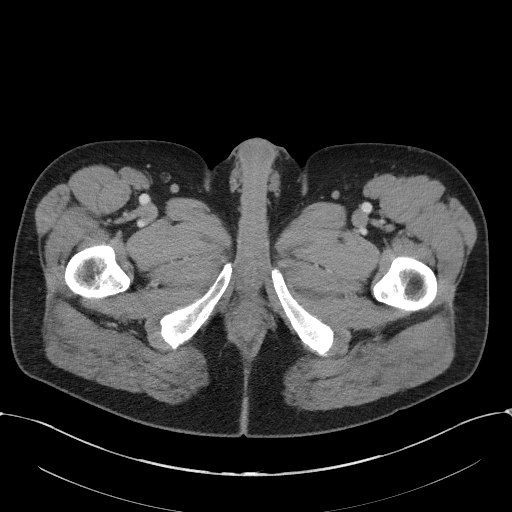
[im 6/108  bone]
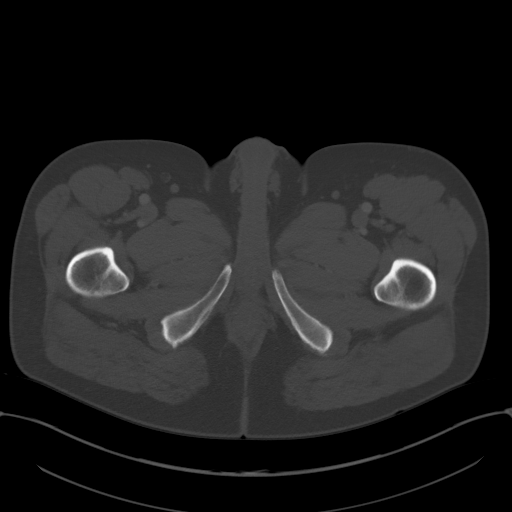
[im 12/108  soft-tissue]
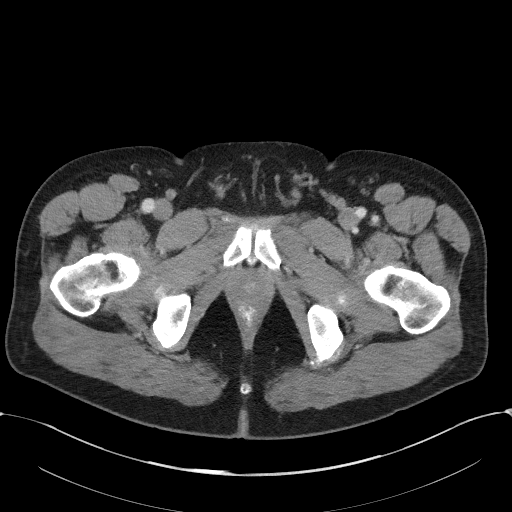
[im 24/108  soft-tissue]
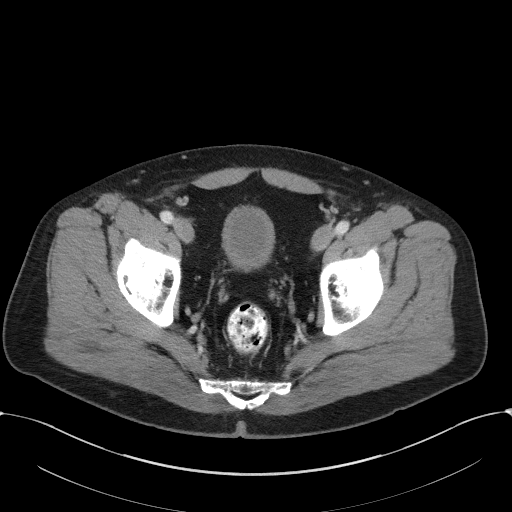
[im 30/108  soft-tissue]
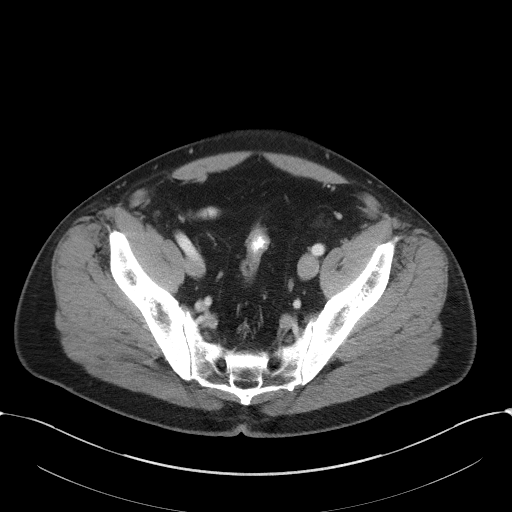
[im 36/108  soft-tissue]
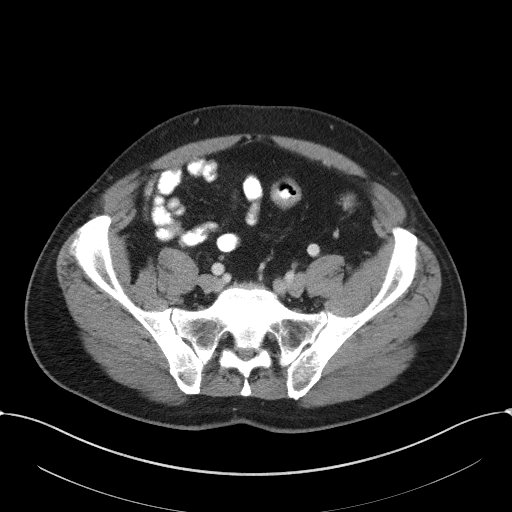
[im 48/108  soft-tissue]
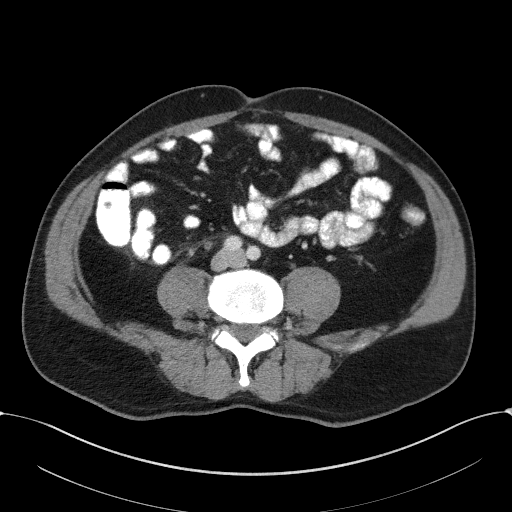
[im 54/108  soft-tissue]
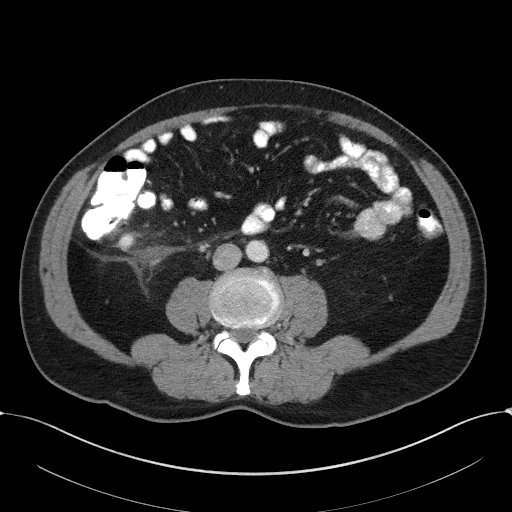
[im 60/108  soft-tissue]
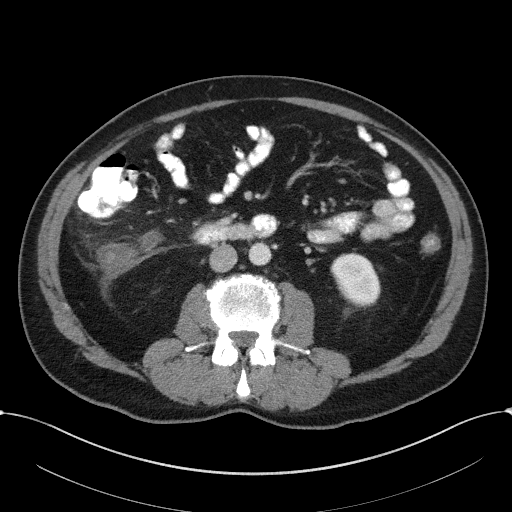
[im 72/108  soft-tissue]
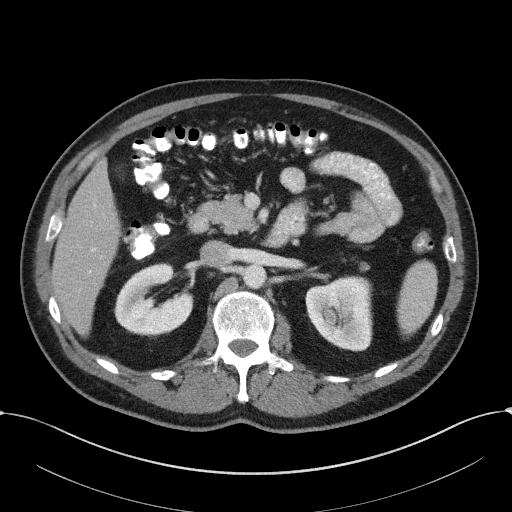
[im 72/108  bone]
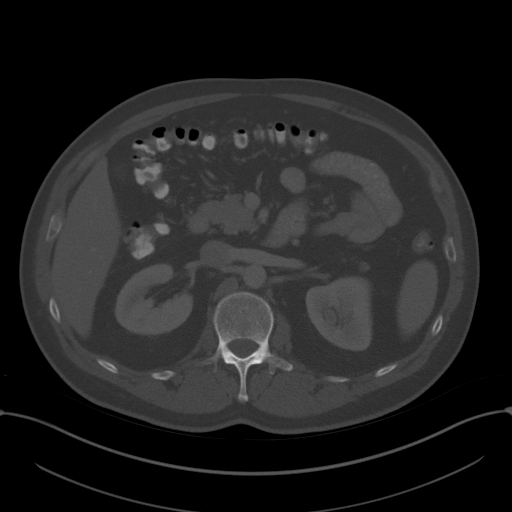
[im 78/108  soft-tissue]
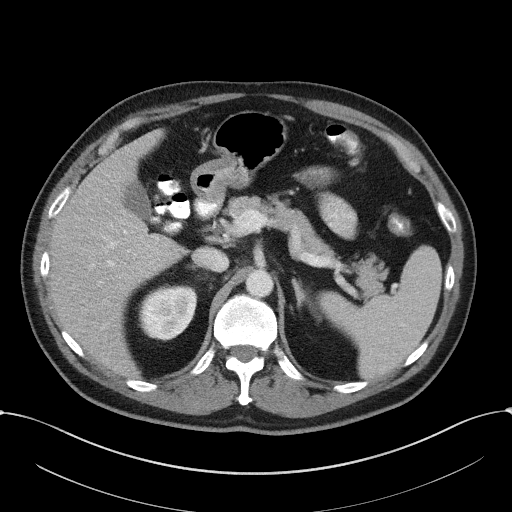
[im 84/108  soft-tissue]
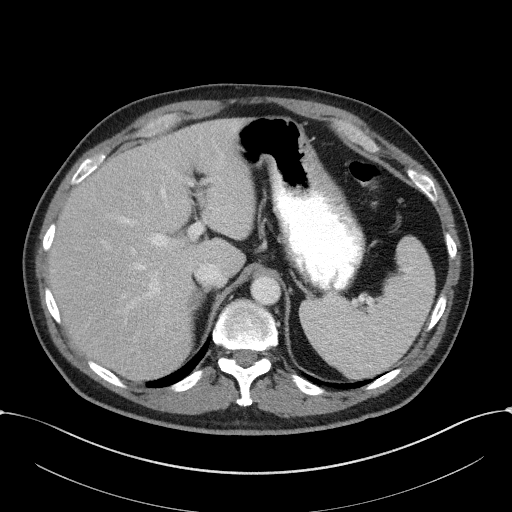
[im 84/108  lung]
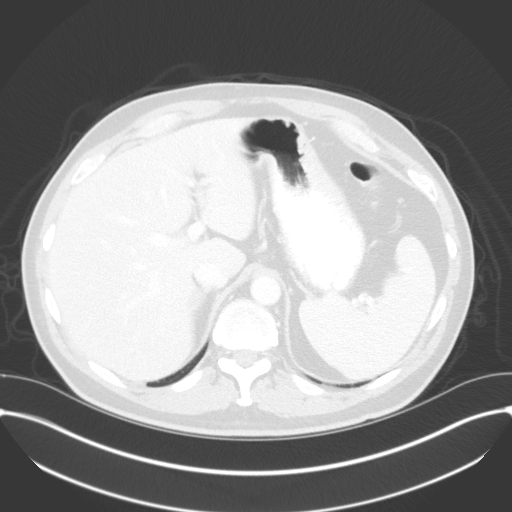
[im 90/108  lung]
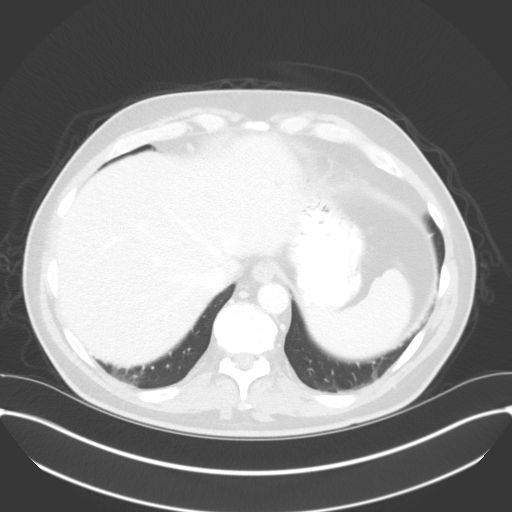
[im 96/108  soft-tissue]
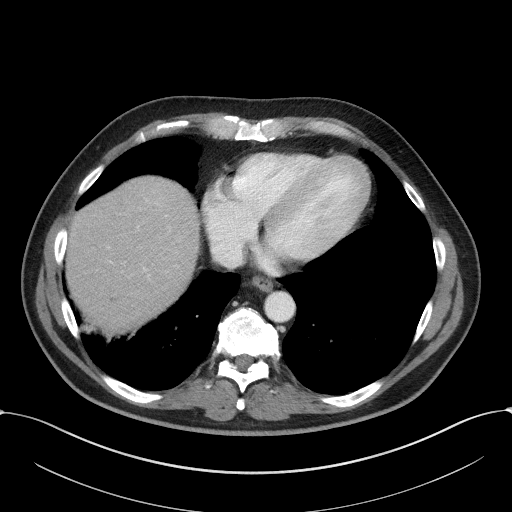
[im 96/108  lung]
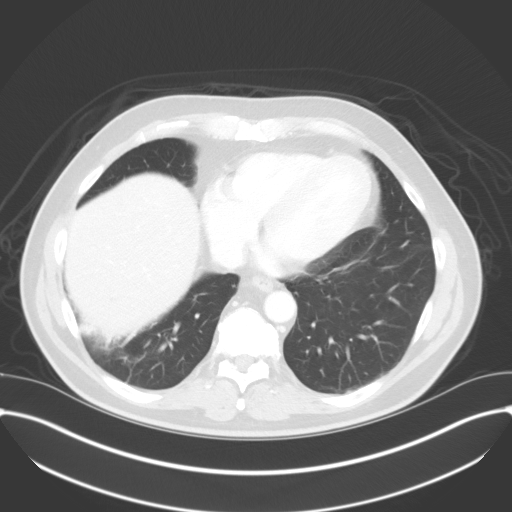
[im 102/108  soft-tissue]
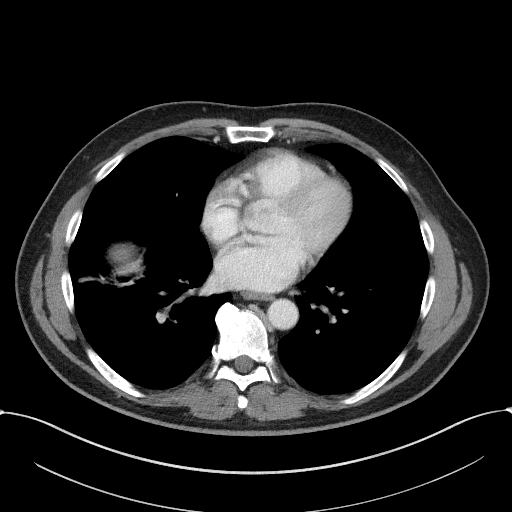
[im 102/108  lung]
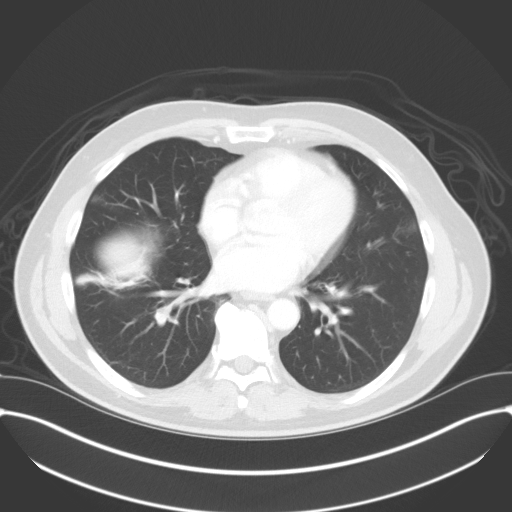

[14 of 32 positions shown; findings below may reference images not displayed]

FINDINGS: Mild atelectasis is seen in the lung bases. No pleural or
pericardial effusion. Calcific coronary artery disease is noted.

Scattered small low attenuating lesions in the liver most consistent
with cysts are identified. The liver is otherwise unremarkable. The
spleen, gallbladder, adrenal glands, pancreas and kidneys appear
normal.

The appendix is markedly dilated with periappendiceal inflammatory
change consistent with acute appendicitis. The appendix is
positioned in the right mid abdomen with its tip just below the
inferior most right hepatic lobe. The stomach, small bowel and colon
appear normal. There is no lymphadenopathy. Urinary bladder,
prostate gland and seminal vesicles are unremarkable.

No focal bony abnormality is identified.
IMPRESSION: The study is positive for acute appendicitis.

Calcific coronary artery disease.

Critical Value/emergent results were called by telephone at the time
of interpretation on 01/19/2016 at [DATE] to DR. ELLYN LAU, who
verbally acknowledged these results.

## 2018-01-22 DIAGNOSIS — Z8249 Family history of ischemic heart disease and other diseases of the circulatory system: Secondary | ICD-10-CM | POA: Diagnosis not present

## 2018-01-22 DIAGNOSIS — L821 Other seborrheic keratosis: Secondary | ICD-10-CM | POA: Diagnosis not present

## 2018-01-22 DIAGNOSIS — N529 Male erectile dysfunction, unspecified: Secondary | ICD-10-CM | POA: Diagnosis not present

## 2018-01-23 DIAGNOSIS — Z79899 Other long term (current) drug therapy: Secondary | ICD-10-CM | POA: Diagnosis not present

## 2018-01-23 DIAGNOSIS — E782 Mixed hyperlipidemia: Secondary | ICD-10-CM | POA: Diagnosis not present

## 2018-09-30 DIAGNOSIS — Z79899 Other long term (current) drug therapy: Secondary | ICD-10-CM | POA: Diagnosis not present

## 2018-09-30 DIAGNOSIS — E782 Mixed hyperlipidemia: Secondary | ICD-10-CM | POA: Diagnosis not present

## 2018-09-30 DIAGNOSIS — Z125 Encounter for screening for malignant neoplasm of prostate: Secondary | ICD-10-CM | POA: Diagnosis not present

## 2018-10-06 DIAGNOSIS — Z23 Encounter for immunization: Secondary | ICD-10-CM | POA: Diagnosis not present

## 2018-10-06 DIAGNOSIS — E7800 Pure hypercholesterolemia, unspecified: Secondary | ICD-10-CM | POA: Diagnosis not present

## 2018-10-06 DIAGNOSIS — N529 Male erectile dysfunction, unspecified: Secondary | ICD-10-CM | POA: Diagnosis not present

## 2018-10-06 DIAGNOSIS — Z0001 Encounter for general adult medical examination with abnormal findings: Secondary | ICD-10-CM | POA: Diagnosis not present

## 2019-05-04 DIAGNOSIS — H524 Presbyopia: Secondary | ICD-10-CM | POA: Diagnosis not present

## 2019-05-04 DIAGNOSIS — H1789 Other corneal scars and opacities: Secondary | ICD-10-CM | POA: Diagnosis not present

## 2019-05-28 DIAGNOSIS — H11421 Conjunctival edema, right eye: Secondary | ICD-10-CM | POA: Diagnosis not present

## 2019-06-08 DIAGNOSIS — H11421 Conjunctival edema, right eye: Secondary | ICD-10-CM | POA: Diagnosis not present

## 2019-09-11 DIAGNOSIS — S43401A Unspecified sprain of right shoulder joint, initial encounter: Secondary | ICD-10-CM | POA: Diagnosis not present

## 2019-09-11 DIAGNOSIS — M25511 Pain in right shoulder: Secondary | ICD-10-CM | POA: Diagnosis not present

## 2019-09-11 DIAGNOSIS — Z6828 Body mass index (BMI) 28.0-28.9, adult: Secondary | ICD-10-CM | POA: Diagnosis not present

## 2019-09-15 DIAGNOSIS — M6281 Muscle weakness (generalized): Secondary | ICD-10-CM | POA: Diagnosis not present

## 2019-09-15 DIAGNOSIS — S43401A Unspecified sprain of right shoulder joint, initial encounter: Secondary | ICD-10-CM | POA: Diagnosis not present

## 2019-09-15 DIAGNOSIS — M25511 Pain in right shoulder: Secondary | ICD-10-CM | POA: Diagnosis not present

## 2019-09-16 DIAGNOSIS — M6281 Muscle weakness (generalized): Secondary | ICD-10-CM | POA: Diagnosis not present

## 2019-09-16 DIAGNOSIS — M25511 Pain in right shoulder: Secondary | ICD-10-CM | POA: Diagnosis not present

## 2019-09-16 DIAGNOSIS — S43401A Unspecified sprain of right shoulder joint, initial encounter: Secondary | ICD-10-CM | POA: Diagnosis not present

## 2019-09-22 DIAGNOSIS — M6281 Muscle weakness (generalized): Secondary | ICD-10-CM | POA: Diagnosis not present

## 2019-09-22 DIAGNOSIS — M25511 Pain in right shoulder: Secondary | ICD-10-CM | POA: Diagnosis not present

## 2019-09-22 DIAGNOSIS — S43401A Unspecified sprain of right shoulder joint, initial encounter: Secondary | ICD-10-CM | POA: Diagnosis not present

## 2019-09-30 DIAGNOSIS — M25511 Pain in right shoulder: Secondary | ICD-10-CM | POA: Diagnosis not present

## 2019-09-30 DIAGNOSIS — M6281 Muscle weakness (generalized): Secondary | ICD-10-CM | POA: Diagnosis not present

## 2019-09-30 DIAGNOSIS — S43401A Unspecified sprain of right shoulder joint, initial encounter: Secondary | ICD-10-CM | POA: Diagnosis not present

## 2019-10-02 DIAGNOSIS — S43401A Unspecified sprain of right shoulder joint, initial encounter: Secondary | ICD-10-CM | POA: Diagnosis not present

## 2019-10-02 DIAGNOSIS — M6281 Muscle weakness (generalized): Secondary | ICD-10-CM | POA: Diagnosis not present

## 2019-10-02 DIAGNOSIS — M25511 Pain in right shoulder: Secondary | ICD-10-CM | POA: Diagnosis not present

## 2019-10-05 DIAGNOSIS — Z131 Encounter for screening for diabetes mellitus: Secondary | ICD-10-CM | POA: Diagnosis not present

## 2019-10-05 DIAGNOSIS — Z79899 Other long term (current) drug therapy: Secondary | ICD-10-CM | POA: Diagnosis not present

## 2019-10-05 DIAGNOSIS — R718 Other abnormality of red blood cells: Secondary | ICD-10-CM | POA: Diagnosis not present

## 2019-10-05 DIAGNOSIS — Z125 Encounter for screening for malignant neoplasm of prostate: Secondary | ICD-10-CM | POA: Diagnosis not present

## 2019-10-05 DIAGNOSIS — E7800 Pure hypercholesterolemia, unspecified: Secondary | ICD-10-CM | POA: Diagnosis not present

## 2019-10-06 DIAGNOSIS — M25511 Pain in right shoulder: Secondary | ICD-10-CM | POA: Diagnosis not present

## 2019-10-06 DIAGNOSIS — S43401A Unspecified sprain of right shoulder joint, initial encounter: Secondary | ICD-10-CM | POA: Diagnosis not present

## 2019-10-06 DIAGNOSIS — M6281 Muscle weakness (generalized): Secondary | ICD-10-CM | POA: Diagnosis not present

## 2019-10-08 DIAGNOSIS — M6281 Muscle weakness (generalized): Secondary | ICD-10-CM | POA: Diagnosis not present

## 2019-10-08 DIAGNOSIS — S43401A Unspecified sprain of right shoulder joint, initial encounter: Secondary | ICD-10-CM | POA: Diagnosis not present

## 2019-10-08 DIAGNOSIS — M25511 Pain in right shoulder: Secondary | ICD-10-CM | POA: Diagnosis not present

## 2019-10-12 DIAGNOSIS — M109 Gout, unspecified: Secondary | ICD-10-CM | POA: Diagnosis not present

## 2019-10-12 DIAGNOSIS — D649 Anemia, unspecified: Secondary | ICD-10-CM | POA: Diagnosis not present

## 2019-10-12 DIAGNOSIS — Z0001 Encounter for general adult medical examination with abnormal findings: Secondary | ICD-10-CM | POA: Diagnosis not present

## 2019-10-12 DIAGNOSIS — Z23 Encounter for immunization: Secondary | ICD-10-CM | POA: Diagnosis not present

## 2019-10-12 DIAGNOSIS — E7800 Pure hypercholesterolemia, unspecified: Secondary | ICD-10-CM | POA: Diagnosis not present

## 2019-10-13 DIAGNOSIS — M6281 Muscle weakness (generalized): Secondary | ICD-10-CM | POA: Diagnosis not present

## 2019-10-13 DIAGNOSIS — M25511 Pain in right shoulder: Secondary | ICD-10-CM | POA: Diagnosis not present

## 2019-10-13 DIAGNOSIS — S43401A Unspecified sprain of right shoulder joint, initial encounter: Secondary | ICD-10-CM | POA: Diagnosis not present

## 2019-10-20 DIAGNOSIS — M6281 Muscle weakness (generalized): Secondary | ICD-10-CM | POA: Diagnosis not present

## 2019-10-20 DIAGNOSIS — S43401A Unspecified sprain of right shoulder joint, initial encounter: Secondary | ICD-10-CM | POA: Diagnosis not present

## 2019-10-20 DIAGNOSIS — M25511 Pain in right shoulder: Secondary | ICD-10-CM | POA: Diagnosis not present

## 2019-10-27 DIAGNOSIS — S43401A Unspecified sprain of right shoulder joint, initial encounter: Secondary | ICD-10-CM | POA: Diagnosis not present

## 2019-10-27 DIAGNOSIS — M25511 Pain in right shoulder: Secondary | ICD-10-CM | POA: Diagnosis not present

## 2019-10-27 DIAGNOSIS — M6281 Muscle weakness (generalized): Secondary | ICD-10-CM | POA: Diagnosis not present

## 2019-10-29 DIAGNOSIS — M25511 Pain in right shoulder: Secondary | ICD-10-CM | POA: Diagnosis not present

## 2019-10-29 DIAGNOSIS — S43401A Unspecified sprain of right shoulder joint, initial encounter: Secondary | ICD-10-CM | POA: Diagnosis not present

## 2019-10-29 DIAGNOSIS — M6281 Muscle weakness (generalized): Secondary | ICD-10-CM | POA: Diagnosis not present

## 2019-11-02 DIAGNOSIS — M6281 Muscle weakness (generalized): Secondary | ICD-10-CM | POA: Diagnosis not present

## 2019-11-02 DIAGNOSIS — S43401A Unspecified sprain of right shoulder joint, initial encounter: Secondary | ICD-10-CM | POA: Diagnosis not present

## 2019-11-02 DIAGNOSIS — M25511 Pain in right shoulder: Secondary | ICD-10-CM | POA: Diagnosis not present

## 2019-11-05 DIAGNOSIS — S43401A Unspecified sprain of right shoulder joint, initial encounter: Secondary | ICD-10-CM | POA: Diagnosis not present

## 2019-11-05 DIAGNOSIS — M25511 Pain in right shoulder: Secondary | ICD-10-CM | POA: Diagnosis not present

## 2019-11-05 DIAGNOSIS — M6281 Muscle weakness (generalized): Secondary | ICD-10-CM | POA: Diagnosis not present

## 2019-11-06 DIAGNOSIS — S43401A Unspecified sprain of right shoulder joint, initial encounter: Secondary | ICD-10-CM | POA: Diagnosis not present

## 2019-11-06 DIAGNOSIS — Z6828 Body mass index (BMI) 28.0-28.9, adult: Secondary | ICD-10-CM | POA: Diagnosis not present

## 2019-11-20 DIAGNOSIS — Z6828 Body mass index (BMI) 28.0-28.9, adult: Secondary | ICD-10-CM | POA: Diagnosis not present

## 2019-11-20 DIAGNOSIS — Z20822 Contact with and (suspected) exposure to covid-19: Secondary | ICD-10-CM | POA: Diagnosis not present

## 2019-11-25 DIAGNOSIS — S43401A Unspecified sprain of right shoulder joint, initial encounter: Secondary | ICD-10-CM | POA: Diagnosis not present

## 2019-11-25 DIAGNOSIS — S43431A Superior glenoid labrum lesion of right shoulder, initial encounter: Secondary | ICD-10-CM | POA: Diagnosis not present

## 2019-12-07 DIAGNOSIS — Z6828 Body mass index (BMI) 28.0-28.9, adult: Secondary | ICD-10-CM | POA: Diagnosis not present

## 2019-12-07 DIAGNOSIS — S43401A Unspecified sprain of right shoulder joint, initial encounter: Secondary | ICD-10-CM | POA: Diagnosis not present

## 2020-03-29 DIAGNOSIS — Z1211 Encounter for screening for malignant neoplasm of colon: Secondary | ICD-10-CM | POA: Diagnosis not present

## 2020-03-29 DIAGNOSIS — Z79899 Other long term (current) drug therapy: Secondary | ICD-10-CM | POA: Diagnosis not present

## 2020-03-29 DIAGNOSIS — K621 Rectal polyp: Secondary | ICD-10-CM | POA: Diagnosis not present

## 2020-03-29 DIAGNOSIS — E7800 Pure hypercholesterolemia, unspecified: Secondary | ICD-10-CM | POA: Diagnosis not present

## 2020-03-29 DIAGNOSIS — Z87891 Personal history of nicotine dependence: Secondary | ICD-10-CM | POA: Diagnosis not present

## 2020-03-29 DIAGNOSIS — Z8249 Family history of ischemic heart disease and other diseases of the circulatory system: Secondary | ICD-10-CM | POA: Diagnosis not present

## 2020-03-29 DIAGNOSIS — M109 Gout, unspecified: Secondary | ICD-10-CM | POA: Diagnosis not present

## 2020-03-29 DIAGNOSIS — Z881 Allergy status to other antibiotic agents status: Secondary | ICD-10-CM | POA: Diagnosis not present

## 2020-04-04 ENCOUNTER — Other Ambulatory Visit: Payer: Self-pay

## 2020-04-04 DIAGNOSIS — Z79899 Other long term (current) drug therapy: Secondary | ICD-10-CM | POA: Diagnosis not present

## 2020-04-04 DIAGNOSIS — E7800 Pure hypercholesterolemia, unspecified: Secondary | ICD-10-CM | POA: Diagnosis not present

## 2020-04-04 DIAGNOSIS — H9193 Unspecified hearing loss, bilateral: Secondary | ICD-10-CM | POA: Diagnosis not present

## 2020-04-04 DIAGNOSIS — M109 Gout, unspecified: Secondary | ICD-10-CM | POA: Diagnosis not present

## 2020-05-04 DIAGNOSIS — M109 Gout, unspecified: Secondary | ICD-10-CM | POA: Diagnosis not present

## 2020-05-04 DIAGNOSIS — Z79899 Other long term (current) drug therapy: Secondary | ICD-10-CM | POA: Diagnosis not present

## 2020-05-16 DIAGNOSIS — H903 Sensorineural hearing loss, bilateral: Secondary | ICD-10-CM | POA: Diagnosis not present

## 2020-05-16 DIAGNOSIS — H9311 Tinnitus, right ear: Secondary | ICD-10-CM | POA: Diagnosis not present

## 2020-07-29 DIAGNOSIS — H524 Presbyopia: Secondary | ICD-10-CM | POA: Diagnosis not present

## 2020-07-29 DIAGNOSIS — H04123 Dry eye syndrome of bilateral lacrimal glands: Secondary | ICD-10-CM | POA: Diagnosis not present

## 2020-11-16 ENCOUNTER — Other Ambulatory Visit: Payer: Self-pay

## 2020-11-16 DIAGNOSIS — Z0000 Encounter for general adult medical examination without abnormal findings: Secondary | ICD-10-CM | POA: Diagnosis not present

## 2020-11-16 DIAGNOSIS — Z125 Encounter for screening for malignant neoplasm of prostate: Secondary | ICD-10-CM | POA: Diagnosis not present

## 2020-11-16 DIAGNOSIS — E559 Vitamin D deficiency, unspecified: Secondary | ICD-10-CM | POA: Diagnosis not present

## 2020-11-16 DIAGNOSIS — M109 Gout, unspecified: Secondary | ICD-10-CM | POA: Diagnosis not present

## 2020-11-16 DIAGNOSIS — R6889 Other general symptoms and signs: Secondary | ICD-10-CM | POA: Diagnosis not present

## 2020-11-16 DIAGNOSIS — E782 Mixed hyperlipidemia: Secondary | ICD-10-CM | POA: Diagnosis not present

## 2020-11-17 DIAGNOSIS — Z0000 Encounter for general adult medical examination without abnormal findings: Secondary | ICD-10-CM | POA: Diagnosis not present

## 2020-11-17 DIAGNOSIS — M109 Gout, unspecified: Secondary | ICD-10-CM | POA: Diagnosis not present

## 2020-11-17 DIAGNOSIS — R6889 Other general symptoms and signs: Secondary | ICD-10-CM | POA: Diagnosis not present

## 2020-11-17 DIAGNOSIS — Z125 Encounter for screening for malignant neoplasm of prostate: Secondary | ICD-10-CM | POA: Diagnosis not present

## 2020-11-17 DIAGNOSIS — E782 Mixed hyperlipidemia: Secondary | ICD-10-CM | POA: Diagnosis not present

## 2020-11-17 DIAGNOSIS — E559 Vitamin D deficiency, unspecified: Secondary | ICD-10-CM | POA: Diagnosis not present

## 2020-11-18 DIAGNOSIS — E875 Hyperkalemia: Secondary | ICD-10-CM | POA: Diagnosis not present

## 2020-12-30 DIAGNOSIS — R972 Elevated prostate specific antigen [PSA]: Secondary | ICD-10-CM | POA: Diagnosis not present

## 2021-02-14 ENCOUNTER — Other Ambulatory Visit: Payer: Self-pay

## 2021-02-14 MED FILL — Atorvastatin Calcium Tab 10 MG (Base Equivalent): ORAL | 90 days supply | Qty: 90 | Fill #0 | Status: AC

## 2021-02-17 ENCOUNTER — Other Ambulatory Visit: Payer: Self-pay

## 2021-05-20 MED FILL — Atorvastatin Calcium Tab 10 MG (Base Equivalent): ORAL | 90 days supply | Qty: 90 | Fill #1 | Status: AC

## 2021-05-22 ENCOUNTER — Other Ambulatory Visit: Payer: Self-pay

## 2021-05-23 DIAGNOSIS — E782 Mixed hyperlipidemia: Secondary | ICD-10-CM | POA: Diagnosis not present

## 2021-05-23 DIAGNOSIS — N529 Male erectile dysfunction, unspecified: Secondary | ICD-10-CM | POA: Diagnosis not present

## 2021-05-23 DIAGNOSIS — M109 Gout, unspecified: Secondary | ICD-10-CM | POA: Diagnosis not present

## 2021-05-23 DIAGNOSIS — N1831 Chronic kidney disease, stage 3a: Secondary | ICD-10-CM | POA: Diagnosis not present

## 2021-05-23 DIAGNOSIS — H6121 Impacted cerumen, right ear: Secondary | ICD-10-CM | POA: Diagnosis not present

## 2021-05-23 DIAGNOSIS — D649 Anemia, unspecified: Secondary | ICD-10-CM | POA: Diagnosis not present

## 2021-05-23 DIAGNOSIS — R030 Elevated blood-pressure reading, without diagnosis of hypertension: Secondary | ICD-10-CM | POA: Diagnosis not present

## 2021-05-23 DIAGNOSIS — Z23 Encounter for immunization: Secondary | ICD-10-CM | POA: Diagnosis not present

## 2021-06-29 DIAGNOSIS — R972 Elevated prostate specific antigen [PSA]: Secondary | ICD-10-CM | POA: Diagnosis not present

## 2021-06-29 DIAGNOSIS — D649 Anemia, unspecified: Secondary | ICD-10-CM | POA: Diagnosis not present

## 2021-07-14 DIAGNOSIS — R972 Elevated prostate specific antigen [PSA]: Secondary | ICD-10-CM | POA: Diagnosis not present

## 2021-08-16 ENCOUNTER — Other Ambulatory Visit: Payer: Self-pay

## 2021-08-17 ENCOUNTER — Other Ambulatory Visit: Payer: Self-pay

## 2021-08-17 MED FILL — Atorvastatin Calcium Tab 10 MG (Base Equivalent): ORAL | 90 days supply | Qty: 90 | Fill #0 | Status: AC

## 2021-08-18 ENCOUNTER — Other Ambulatory Visit: Payer: Self-pay

## 2021-08-18 MED ORDER — CARESTART COVID-19 HOME TEST VI KIT
PACK | 0 refills | Status: AC
  Filled 2021-08-18: qty 2, 4d supply, fill #0

## 2021-11-02 DIAGNOSIS — H524 Presbyopia: Secondary | ICD-10-CM | POA: Diagnosis not present

## 2021-11-02 DIAGNOSIS — H5203 Hypermetropia, bilateral: Secondary | ICD-10-CM | POA: Diagnosis not present

## 2021-11-13 ENCOUNTER — Other Ambulatory Visit: Payer: Self-pay

## 2021-11-13 MED FILL — Atorvastatin Calcium Tab 10 MG (Base Equivalent): ORAL | 90 days supply | Qty: 90 | Fill #1 | Status: AC

## 2021-12-20 DIAGNOSIS — I10 Essential (primary) hypertension: Secondary | ICD-10-CM | POA: Diagnosis not present

## 2021-12-20 DIAGNOSIS — E782 Mixed hyperlipidemia: Secondary | ICD-10-CM | POA: Diagnosis not present

## 2021-12-20 DIAGNOSIS — Z125 Encounter for screening for malignant neoplasm of prostate: Secondary | ICD-10-CM | POA: Diagnosis not present

## 2021-12-20 DIAGNOSIS — M1A09X0 Idiopathic chronic gout, multiple sites, without tophus (tophi): Secondary | ICD-10-CM | POA: Diagnosis not present

## 2021-12-20 DIAGNOSIS — N1831 Chronic kidney disease, stage 3a: Secondary | ICD-10-CM | POA: Diagnosis not present

## 2021-12-20 DIAGNOSIS — R6889 Other general symptoms and signs: Secondary | ICD-10-CM | POA: Diagnosis not present

## 2021-12-20 DIAGNOSIS — E559 Vitamin D deficiency, unspecified: Secondary | ICD-10-CM | POA: Diagnosis not present

## 2021-12-20 DIAGNOSIS — Z0000 Encounter for general adult medical examination without abnormal findings: Secondary | ICD-10-CM | POA: Diagnosis not present

## 2021-12-27 DIAGNOSIS — M1A09X0 Idiopathic chronic gout, multiple sites, without tophus (tophi): Secondary | ICD-10-CM | POA: Diagnosis not present

## 2021-12-27 DIAGNOSIS — R6889 Other general symptoms and signs: Secondary | ICD-10-CM | POA: Diagnosis not present

## 2021-12-27 DIAGNOSIS — I10 Essential (primary) hypertension: Secondary | ICD-10-CM | POA: Diagnosis not present

## 2021-12-27 DIAGNOSIS — N1831 Chronic kidney disease, stage 3a: Secondary | ICD-10-CM | POA: Diagnosis not present

## 2021-12-27 DIAGNOSIS — Z125 Encounter for screening for malignant neoplasm of prostate: Secondary | ICD-10-CM | POA: Diagnosis not present

## 2021-12-27 DIAGNOSIS — E782 Mixed hyperlipidemia: Secondary | ICD-10-CM | POA: Diagnosis not present

## 2021-12-27 DIAGNOSIS — E559 Vitamin D deficiency, unspecified: Secondary | ICD-10-CM | POA: Diagnosis not present

## 2021-12-27 DIAGNOSIS — Z0000 Encounter for general adult medical examination without abnormal findings: Secondary | ICD-10-CM | POA: Diagnosis not present

## 2022-02-26 ENCOUNTER — Other Ambulatory Visit: Payer: Self-pay

## 2022-02-27 ENCOUNTER — Other Ambulatory Visit: Payer: Self-pay

## 2022-03-01 ENCOUNTER — Other Ambulatory Visit: Payer: Self-pay

## 2022-03-01 MED ORDER — ATORVASTATIN CALCIUM 10 MG PO TABS
10.0000 mg | ORAL_TABLET | Freq: Every day | ORAL | 2 refills | Status: DC
  Filled 2022-03-01: qty 90, 90d supply, fill #0
  Filled 2022-06-18: qty 90, 90d supply, fill #1
  Filled 2022-09-19: qty 90, 90d supply, fill #2

## 2022-06-18 ENCOUNTER — Other Ambulatory Visit: Payer: Self-pay

## 2022-06-20 ENCOUNTER — Other Ambulatory Visit: Payer: Self-pay

## 2022-07-06 DIAGNOSIS — Z23 Encounter for immunization: Secondary | ICD-10-CM | POA: Diagnosis not present

## 2022-07-06 DIAGNOSIS — R200 Anesthesia of skin: Secondary | ICD-10-CM | POA: Diagnosis not present

## 2022-07-06 DIAGNOSIS — I10 Essential (primary) hypertension: Secondary | ICD-10-CM | POA: Diagnosis not present

## 2022-07-06 DIAGNOSIS — E782 Mixed hyperlipidemia: Secondary | ICD-10-CM | POA: Diagnosis not present

## 2022-07-06 DIAGNOSIS — M1A09X0 Idiopathic chronic gout, multiple sites, without tophus (tophi): Secondary | ICD-10-CM | POA: Diagnosis not present

## 2022-07-12 DIAGNOSIS — E782 Mixed hyperlipidemia: Secondary | ICD-10-CM | POA: Diagnosis not present

## 2022-07-12 DIAGNOSIS — I10 Essential (primary) hypertension: Secondary | ICD-10-CM | POA: Diagnosis not present

## 2022-08-15 ENCOUNTER — Other Ambulatory Visit: Payer: Self-pay

## 2022-08-15 DIAGNOSIS — I10 Essential (primary) hypertension: Secondary | ICD-10-CM | POA: Diagnosis not present

## 2022-08-15 DIAGNOSIS — H9193 Unspecified hearing loss, bilateral: Secondary | ICD-10-CM | POA: Diagnosis not present

## 2022-08-15 DIAGNOSIS — E782 Mixed hyperlipidemia: Secondary | ICD-10-CM | POA: Diagnosis not present

## 2022-08-15 MED ORDER — LOSARTAN POTASSIUM 50 MG PO TABS
ORAL_TABLET | ORAL | 2 refills | Status: DC
  Filled 2022-08-15: qty 90, 90d supply, fill #0
  Filled 2022-10-26: qty 90, 90d supply, fill #1
  Filled 2023-02-11: qty 90, 90d supply, fill #0
  Filled 2023-02-11: qty 90, 90d supply, fill #2

## 2022-09-19 ENCOUNTER — Other Ambulatory Visit: Payer: Self-pay

## 2022-10-26 ENCOUNTER — Other Ambulatory Visit: Payer: Self-pay

## 2022-10-26 MED ORDER — ATORVASTATIN CALCIUM 10 MG PO TABS
10.0000 mg | ORAL_TABLET | Freq: Every day | ORAL | 2 refills | Status: DC
  Filled 2022-10-26 – 2023-03-12 (×4): qty 90, 90d supply, fill #0
  Filled 2023-06-16: qty 90, 90d supply, fill #1

## 2022-10-29 ENCOUNTER — Other Ambulatory Visit: Payer: Self-pay

## 2022-12-07 ENCOUNTER — Other Ambulatory Visit: Payer: Self-pay

## 2022-12-10 ENCOUNTER — Other Ambulatory Visit: Payer: Self-pay

## 2022-12-14 ENCOUNTER — Other Ambulatory Visit (HOSPITAL_COMMUNITY): Payer: Self-pay

## 2023-01-31 DIAGNOSIS — E559 Vitamin D deficiency, unspecified: Secondary | ICD-10-CM | POA: Diagnosis not present

## 2023-01-31 DIAGNOSIS — R972 Elevated prostate specific antigen [PSA]: Secondary | ICD-10-CM | POA: Diagnosis not present

## 2023-01-31 DIAGNOSIS — E782 Mixed hyperlipidemia: Secondary | ICD-10-CM | POA: Diagnosis not present

## 2023-01-31 DIAGNOSIS — H9193 Unspecified hearing loss, bilateral: Secondary | ICD-10-CM | POA: Diagnosis not present

## 2023-01-31 DIAGNOSIS — Z0000 Encounter for general adult medical examination without abnormal findings: Secondary | ICD-10-CM | POA: Diagnosis not present

## 2023-01-31 DIAGNOSIS — R6889 Other general symptoms and signs: Secondary | ICD-10-CM | POA: Diagnosis not present

## 2023-01-31 DIAGNOSIS — I10 Essential (primary) hypertension: Secondary | ICD-10-CM | POA: Diagnosis not present

## 2023-01-31 DIAGNOSIS — Z1331 Encounter for screening for depression: Secondary | ICD-10-CM | POA: Diagnosis not present

## 2023-01-31 DIAGNOSIS — M1A09X0 Idiopathic chronic gout, multiple sites, without tophus (tophi): Secondary | ICD-10-CM | POA: Diagnosis not present

## 2023-02-11 ENCOUNTER — Other Ambulatory Visit (HOSPITAL_COMMUNITY): Payer: Self-pay

## 2023-02-11 ENCOUNTER — Other Ambulatory Visit: Payer: Self-pay

## 2023-02-12 ENCOUNTER — Other Ambulatory Visit: Payer: Self-pay

## 2023-02-12 ENCOUNTER — Other Ambulatory Visit (HOSPITAL_COMMUNITY): Payer: Self-pay

## 2023-03-12 ENCOUNTER — Other Ambulatory Visit (HOSPITAL_COMMUNITY): Payer: Self-pay

## 2023-04-26 ENCOUNTER — Other Ambulatory Visit (HOSPITAL_COMMUNITY): Payer: Self-pay

## 2023-04-26 ENCOUNTER — Other Ambulatory Visit: Payer: Self-pay

## 2023-04-26 MED ORDER — ALLOPURINOL 100 MG PO TABS
200.0000 mg | ORAL_TABLET | Freq: Every day | ORAL | 0 refills | Status: DC
  Filled 2023-04-26: qty 180, 90d supply, fill #0

## 2023-04-30 ENCOUNTER — Other Ambulatory Visit: Payer: Self-pay

## 2023-04-30 ENCOUNTER — Encounter: Payer: Self-pay | Admitting: Pharmacist

## 2023-04-30 ENCOUNTER — Other Ambulatory Visit (HOSPITAL_COMMUNITY): Payer: Self-pay

## 2023-05-03 ENCOUNTER — Other Ambulatory Visit (HOSPITAL_BASED_OUTPATIENT_CLINIC_OR_DEPARTMENT_OTHER): Payer: Self-pay

## 2023-05-21 ENCOUNTER — Other Ambulatory Visit (HOSPITAL_COMMUNITY): Payer: Self-pay

## 2023-05-22 ENCOUNTER — Other Ambulatory Visit (HOSPITAL_COMMUNITY): Payer: Self-pay

## 2023-05-22 MED ORDER — LOSARTAN POTASSIUM 50 MG PO TABS
50.0000 mg | ORAL_TABLET | Freq: Every day | ORAL | 2 refills | Status: DC
  Filled 2023-05-22: qty 90, 90d supply, fill #0
  Filled 2023-08-24: qty 90, 90d supply, fill #1

## 2023-06-16 ENCOUNTER — Other Ambulatory Visit (HOSPITAL_COMMUNITY): Payer: Self-pay

## 2023-06-17 ENCOUNTER — Other Ambulatory Visit (HOSPITAL_COMMUNITY): Payer: Self-pay

## 2023-07-12 DIAGNOSIS — Q179 Congenital malformation of ear, unspecified: Secondary | ICD-10-CM | POA: Diagnosis not present

## 2023-07-15 DIAGNOSIS — T162XXA Foreign body in left ear, initial encounter: Secondary | ICD-10-CM | POA: Diagnosis not present

## 2023-07-15 DIAGNOSIS — H903 Sensorineural hearing loss, bilateral: Secondary | ICD-10-CM | POA: Diagnosis not present

## 2023-07-15 DIAGNOSIS — H9202 Otalgia, left ear: Secondary | ICD-10-CM | POA: Diagnosis not present

## 2023-07-24 ENCOUNTER — Other Ambulatory Visit (HOSPITAL_COMMUNITY): Payer: Self-pay

## 2023-07-24 MED ORDER — ALLOPURINOL 100 MG PO TABS
200.0000 mg | ORAL_TABLET | Freq: Every day | ORAL | 0 refills | Status: DC
  Filled 2023-07-24: qty 180, 90d supply, fill #0

## 2023-08-09 ENCOUNTER — Other Ambulatory Visit (HOSPITAL_COMMUNITY): Payer: Self-pay

## 2023-08-09 DIAGNOSIS — I10 Essential (primary) hypertension: Secondary | ICD-10-CM | POA: Diagnosis not present

## 2023-08-09 DIAGNOSIS — E782 Mixed hyperlipidemia: Secondary | ICD-10-CM | POA: Diagnosis not present

## 2023-08-09 DIAGNOSIS — N529 Male erectile dysfunction, unspecified: Secondary | ICD-10-CM | POA: Diagnosis not present

## 2023-08-09 MED ORDER — SILDENAFIL CITRATE 100 MG PO TABS
100.0000 mg | ORAL_TABLET | Freq: Every day | ORAL | 0 refills | Status: AC | PRN
  Filled 2023-08-09: qty 30, 30d supply, fill #0

## 2023-08-24 ENCOUNTER — Other Ambulatory Visit (HOSPITAL_COMMUNITY): Payer: Self-pay

## 2023-08-27 ENCOUNTER — Other Ambulatory Visit: Payer: Self-pay

## 2023-08-27 ENCOUNTER — Other Ambulatory Visit (HOSPITAL_COMMUNITY): Payer: Self-pay

## 2023-08-27 MED ORDER — LOSARTAN POTASSIUM 50 MG PO TABS
50.0000 mg | ORAL_TABLET | Freq: Every day | ORAL | 1 refills | Status: DC
  Filled 2023-08-27: qty 90, 90d supply, fill #0
  Filled 2023-11-22: qty 90, 90d supply, fill #1

## 2023-09-04 ENCOUNTER — Other Ambulatory Visit (HOSPITAL_COMMUNITY): Payer: Self-pay

## 2023-09-10 ENCOUNTER — Other Ambulatory Visit (HOSPITAL_COMMUNITY): Payer: Self-pay

## 2023-09-11 ENCOUNTER — Other Ambulatory Visit (HOSPITAL_COMMUNITY): Payer: Self-pay

## 2023-09-11 ENCOUNTER — Other Ambulatory Visit: Payer: Self-pay

## 2023-09-11 MED ORDER — ATORVASTATIN CALCIUM 10 MG PO TABS
10.0000 mg | ORAL_TABLET | Freq: Every day | ORAL | 2 refills | Status: AC
Start: 2023-09-11 — End: ?
  Filled 2023-09-11 (×2): qty 90, 90d supply, fill #0
  Filled 2023-12-13 – 2023-12-16 (×3): qty 90, 90d supply, fill #1
  Filled 2024-03-12 (×2): qty 90, 90d supply, fill #2

## 2023-09-12 ENCOUNTER — Other Ambulatory Visit (HOSPITAL_COMMUNITY): Payer: Self-pay

## 2023-10-21 ENCOUNTER — Other Ambulatory Visit (HOSPITAL_BASED_OUTPATIENT_CLINIC_OR_DEPARTMENT_OTHER): Payer: Self-pay

## 2023-10-21 ENCOUNTER — Other Ambulatory Visit (HOSPITAL_COMMUNITY): Payer: Self-pay

## 2023-10-21 MED ORDER — ALLOPURINOL 100 MG PO TABS
200.0000 mg | ORAL_TABLET | Freq: Every day | ORAL | 0 refills | Status: DC
  Filled 2023-10-21: qty 180, 90d supply, fill #0

## 2023-11-22 ENCOUNTER — Other Ambulatory Visit (HOSPITAL_COMMUNITY): Payer: Self-pay

## 2023-11-25 ENCOUNTER — Other Ambulatory Visit (HOSPITAL_COMMUNITY): Payer: Self-pay

## 2023-12-13 ENCOUNTER — Other Ambulatory Visit (HOSPITAL_COMMUNITY): Payer: Self-pay

## 2023-12-14 ENCOUNTER — Other Ambulatory Visit (HOSPITAL_COMMUNITY): Payer: Self-pay

## 2023-12-16 ENCOUNTER — Other Ambulatory Visit: Payer: Self-pay

## 2023-12-16 ENCOUNTER — Other Ambulatory Visit (HOSPITAL_COMMUNITY): Payer: Self-pay

## 2023-12-20 DIAGNOSIS — H5203 Hypermetropia, bilateral: Secondary | ICD-10-CM | POA: Diagnosis not present

## 2024-01-19 ENCOUNTER — Other Ambulatory Visit (HOSPITAL_COMMUNITY): Payer: Self-pay

## 2024-01-19 MED ORDER — ALLOPURINOL 100 MG PO TABS
200.0000 mg | ORAL_TABLET | Freq: Every day | ORAL | 0 refills | Status: DC
  Filled 2024-01-19 – 2024-01-20 (×2): qty 180, 90d supply, fill #0

## 2024-01-20 ENCOUNTER — Other Ambulatory Visit (HOSPITAL_COMMUNITY): Payer: Self-pay

## 2024-01-20 ENCOUNTER — Other Ambulatory Visit: Payer: Self-pay

## 2024-02-07 DIAGNOSIS — E559 Vitamin D deficiency, unspecified: Secondary | ICD-10-CM | POA: Diagnosis not present

## 2024-02-07 DIAGNOSIS — M1A09X0 Idiopathic chronic gout, multiple sites, without tophus (tophi): Secondary | ICD-10-CM | POA: Diagnosis not present

## 2024-02-07 DIAGNOSIS — E782 Mixed hyperlipidemia: Secondary | ICD-10-CM | POA: Diagnosis not present

## 2024-02-07 DIAGNOSIS — E538 Deficiency of other specified B group vitamins: Secondary | ICD-10-CM | POA: Diagnosis not present

## 2024-02-07 DIAGNOSIS — I10 Essential (primary) hypertension: Secondary | ICD-10-CM | POA: Diagnosis not present

## 2024-02-07 DIAGNOSIS — Z1331 Encounter for screening for depression: Secondary | ICD-10-CM | POA: Diagnosis not present

## 2024-02-07 DIAGNOSIS — R972 Elevated prostate specific antigen [PSA]: Secondary | ICD-10-CM | POA: Diagnosis not present

## 2024-02-07 DIAGNOSIS — Z0000 Encounter for general adult medical examination without abnormal findings: Secondary | ICD-10-CM | POA: Diagnosis not present

## 2024-02-17 ENCOUNTER — Other Ambulatory Visit (HOSPITAL_COMMUNITY): Payer: Self-pay

## 2024-02-17 DIAGNOSIS — E875 Hyperkalemia: Secondary | ICD-10-CM | POA: Diagnosis not present

## 2024-02-17 DIAGNOSIS — M1A09X0 Idiopathic chronic gout, multiple sites, without tophus (tophi): Secondary | ICD-10-CM | POA: Diagnosis not present

## 2024-02-17 DIAGNOSIS — D649 Anemia, unspecified: Secondary | ICD-10-CM | POA: Diagnosis not present

## 2024-02-18 ENCOUNTER — Other Ambulatory Visit (HOSPITAL_COMMUNITY): Payer: Self-pay

## 2024-02-18 MED ORDER — LOSARTAN POTASSIUM 50 MG PO TABS
50.0000 mg | ORAL_TABLET | Freq: Every day | ORAL | 1 refills | Status: AC
  Filled 2024-02-18: qty 90, 90d supply, fill #0

## 2024-02-19 ENCOUNTER — Other Ambulatory Visit: Payer: Self-pay

## 2024-03-12 ENCOUNTER — Other Ambulatory Visit (HOSPITAL_COMMUNITY): Payer: Self-pay

## 2024-04-14 ENCOUNTER — Other Ambulatory Visit (HOSPITAL_COMMUNITY): Payer: Self-pay

## 2024-04-15 ENCOUNTER — Other Ambulatory Visit (HOSPITAL_COMMUNITY): Payer: Self-pay

## 2024-04-15 ENCOUNTER — Other Ambulatory Visit: Payer: Self-pay

## 2024-04-15 MED ORDER — ALLOPURINOL 100 MG PO TABS
200.0000 mg | ORAL_TABLET | Freq: Every day | ORAL | 2 refills | Status: AC
  Filled 2024-04-15: qty 180, 90d supply, fill #0
